# Patient Record
Sex: Male | Born: 1945
Health system: Southern US, Community
[De-identification: ages and names within clinical notes are randomized; demographics above are authoritative.]

## PROBLEM LIST (undated history)

## (undated) DIAGNOSIS — C189 Malignant neoplasm of colon, unspecified: Secondary | ICD-10-CM

## (undated) DIAGNOSIS — E785 Hyperlipidemia, unspecified: Secondary | ICD-10-CM

## (undated) DIAGNOSIS — I10 Essential (primary) hypertension: Secondary | ICD-10-CM

## (undated) DIAGNOSIS — R768 Other specified abnormal immunological findings in serum: Secondary | ICD-10-CM

## (undated) DIAGNOSIS — N189 Chronic kidney disease, unspecified: Secondary | ICD-10-CM

## (undated) DIAGNOSIS — M109 Gout, unspecified: Secondary | ICD-10-CM

## (undated) DIAGNOSIS — B20 Human immunodeficiency virus [HIV] disease: Secondary | ICD-10-CM

## (undated) DIAGNOSIS — N400 Enlarged prostate without lower urinary tract symptoms: Secondary | ICD-10-CM

## (undated) DIAGNOSIS — B1920 Unspecified viral hepatitis C without hepatic coma: Secondary | ICD-10-CM

## (undated) DIAGNOSIS — Z21 Asymptomatic human immunodeficiency virus [HIV] infection status: Secondary | ICD-10-CM

## (undated) DIAGNOSIS — K7460 Unspecified cirrhosis of liver: Secondary | ICD-10-CM

## (undated) HISTORY — DX: Essential (primary) hypertension: I10

## (undated) HISTORY — DX: Chronic kidney disease, unspecified: N18.9

## (undated) HISTORY — DX: Benign prostatic hyperplasia without lower urinary tract symptoms: N40.0

## (undated) HISTORY — DX: Hyperlipidemia, unspecified: E78.5

## (undated) HISTORY — DX: Asymptomatic human immunodeficiency virus (hiv) infection status: Z21

## (undated) HISTORY — DX: Unspecified cirrhosis of liver: K74.60

## (undated) HISTORY — PX: COLONOSCOPY: SHX174

## (undated) HISTORY — DX: Gout, unspecified: M10.9

## (undated) HISTORY — DX: Unspecified viral hepatitis C without hepatic coma: B19.20

## (undated) HISTORY — DX: Malignant neoplasm of colon, unspecified: C18.9

## (undated) HISTORY — DX: Human immunodeficiency virus (HIV) disease: B20

## (undated) HISTORY — DX: Other specified abnormal immunological findings in serum: R76.8

---

## 2004-12-31 ENCOUNTER — Ambulatory Visit (HOSPITAL_COMMUNITY): Admission: RE | Admit: 2004-12-31 | Discharge: 2004-12-31 | Payer: Self-pay | Admitting: Specialist

## 2004-12-31 ENCOUNTER — Encounter: Payer: Self-pay | Admitting: Gastroenterology

## 2016-06-15 ENCOUNTER — Encounter: Payer: Self-pay | Admitting: Gastroenterology

## 2016-09-06 ENCOUNTER — Encounter: Payer: Self-pay | Admitting: Gastroenterology

## 2016-10-29 ENCOUNTER — Encounter: Payer: Self-pay | Admitting: Gastroenterology

## 2016-11-02 ENCOUNTER — Ambulatory Visit (INDEPENDENT_AMBULATORY_CARE_PROVIDER_SITE_OTHER): Payer: Medicare Other | Admitting: Nurse Practitioner

## 2016-11-02 ENCOUNTER — Encounter: Payer: Self-pay | Admitting: Nurse Practitioner

## 2016-11-02 DIAGNOSIS — R768 Other specified abnormal immunological findings in serum: Secondary | ICD-10-CM

## 2016-11-02 LAB — CBC WITH DIFFERENTIAL/PLATELET
BASOS PCT: 1 %
Basophils Absolute: 42 cells/uL (ref 0–200)
EOS PCT: 3 %
Eosinophils Absolute: 126 cells/uL (ref 15–500)
HCT: 30.1 % — ABNORMAL LOW (ref 38.5–50.0)
Hemoglobin: 10.4 g/dL — ABNORMAL LOW (ref 13.2–17.1)
LYMPHS PCT: 28 %
Lymphs Abs: 1176 cells/uL (ref 850–3900)
MCH: 33.8 pg — ABNORMAL HIGH (ref 27.0–33.0)
MCHC: 34.6 g/dL (ref 32.0–36.0)
MCV: 97.7 fL (ref 80.0–100.0)
MONO ABS: 546 {cells}/uL (ref 200–950)
MONOS PCT: 13 %
MPV: 9.3 fL (ref 7.5–12.5)
NEUTROS PCT: 55 %
Neutro Abs: 2310 cells/uL (ref 1500–7800)
PLATELETS: 175 10*3/uL (ref 140–400)
RBC: 3.08 MIL/uL — AB (ref 4.20–5.80)
RDW: 14.1 % (ref 11.0–15.0)
WBC: 4.2 10*3/uL (ref 3.8–10.8)

## 2016-11-02 LAB — COMPREHENSIVE METABOLIC PANEL
ALK PHOS: 114 U/L (ref 40–115)
ALT: 149 U/L — AB (ref 9–46)
AST: 214 U/L — AB (ref 10–35)
Albumin: 3.3 g/dL — ABNORMAL LOW (ref 3.6–5.1)
BILIRUBIN TOTAL: 0.8 mg/dL (ref 0.2–1.2)
BUN: 17 mg/dL (ref 7–25)
CO2: 25 mmol/L (ref 20–32)
Calcium: 9.2 mg/dL (ref 8.6–10.3)
Chloride: 106 mmol/L (ref 98–110)
Creat: 1.27 mg/dL — ABNORMAL HIGH (ref 0.70–1.25)
GLUCOSE: 108 mg/dL — AB (ref 65–99)
Potassium: 3.9 mmol/L (ref 3.5–5.3)
SODIUM: 136 mmol/L (ref 135–146)
Total Protein: 8.6 g/dL — ABNORMAL HIGH (ref 6.1–8.1)

## 2016-11-02 NOTE — Patient Instructions (Signed)
1. Have your blood tests drawn when you're able to. 2. We will schedule the ultrasound of your liver. 3. We will request your previous colonoscopy records from [facility] in order to have been scanned into our system. 4. Return for follow-up in 2 months. 5. We will discuss scheduling of your colonoscopy when you follow-up.

## 2016-11-02 NOTE — Progress Notes (Signed)
Primary Care Physician:  Barry Dienes, NP Primary Gastroenterologist:  Dr. Oneida Alar  Chief Complaint  Patient presents with  . Hepatitis C    HPI:   Noah Robinson is a 71 y.o. male who presents on referral from primary care for positive hepatitis C antibody. Referral included "hepatitis C antibody test positive." No other labs or notes received from primary care. No labs or imaging found in our system.  He is a poor historian and not sure of his health history. Today he states he feels good overall. States he had a bunch of bloodwork completed and doesn't know which tests specifically. Denies abdominal pain, N/V, hematochezia, melena. States he thinks he may have had colon cancer cause he had radiation to his colon "but not sure if I had cancer." Sees Dr. Joya Gaskins in [place], [place] this week. Denies yellowing of skin/eyes, darkened urine, acute episodic confusion, tremors. Denies chest pain, dyspnea, dizziness, lightheadedness, syncope, near syncope. Denies any other upper or lower GI symptoms.  His last colonoscopy was about 10 years ago with Dr. Tamala Julian at [facility]. Denies history of colon surgery.  Hepatitis C Risk Factors:  Birth cohort ([place]): YES IV drug use: No Tattoos: No Blood product transfusion: No HC worker: No Hemodialysis: No Maternal infection: Not that he's aware  Past Medical History:  Diagnosis Date  . Colon cancer ([place])    per patient recollection  . Enlarged prostate     Past Surgical History:  Procedure Laterality Date  . COLONOSCOPY     about 10 years ago    Current Outpatient Prescriptions  Medication Sig Dispense Refill  . tamsulosin (FLOMAX) 0.4 MG CAPS capsule   1   No current facility-administered medications for this visit.     Allergies as of 11/02/2016  . (No Known Allergies)    Family History  Problem Relation Age of Onset  . Colon cancer Neg Hx     Social History   Social History  . Marital status: Divorced   Spouse name: N/A  . Number of children: N/A  . Years of education: N/A   Occupational History  . Not on file.   Social History Main Topics  . Smoking status: Never Smoker  . Smokeless tobacco: Never Used  . Alcohol use No  . Drug use: Yes    Types: Marijuana     Comment: about once a month  . Sexual activity: Not on file   Other Topics Concern  . Not on file   Social History Narrative  . No narrative on file    Review of Systems: Complete ROS negative except as per HPI.    Physical Exam: BP 135/82   Pulse 80   Temp (!) 97.3 F (36.3 C) (Oral)   Ht 5\' 9"  (1.753 m)   Wt 158 lb 12.8 oz (72 kg)   BMI 23.45 kg/m  General:   Alert and oriented. Pleasant and cooperative. Well-nourished and well-developed.  Head:  Normocephalic and atraumatic. Eyes:  Without icterus, sclera clear and conjunctiva pink.  Ears:  Normal auditory acuity. Cardiovascular:  S1, S2 present without murmurs appreciated. Extremities without clubbing or edema. Respiratory:  Clear to auscultation bilaterally. No wheezes, rales, or rhonchi. No distress.  Gastrointestinal:  +BS, soft, non-tender and non-distended. No guarding or rebound. No masses appreciated.  Rectal:  Deferred  Musculoskalatal:  Symmetrical without gross deformities. Neurologic:  Alert and oriented x4;  grossly normal neurologically. Psych:  Alert and cooperative. Normal mood  and affect. Heme/Lymph/Immune: No excessive bruising noted.    11/02/2016 10:01 AM   Disclaimer: This note was dictated with voice recognition software. Similar sounding words can inadvertently be transcribed and may not be corrected upon review.

## 2016-11-02 NOTE — Assessment & Plan Note (Signed)
Referred by primary care for positive hepatitis C antibody. He is generally asymptomatic from a GI and hepatic standpoint. We will proceed with further workup including HCV RNA confirmation, genotype, CBC, CMP, ultrasound elastography to evaluate for fibrosis/cirrhosis. I have explained to the patient the need to do all these tests to determine what his treatment options are. He is agreeable. He is asking to defer scheduling of his colonoscopy until his follow-up visit. That will give [place] time to request records from [facility] for his previous procedure. Return for follow-up in 2 months.

## 2016-11-03 LAB — PROTIME-INR
INR: 1.2 — ABNORMAL HIGH
Prothrombin Time: 12.4 s — ABNORMAL HIGH (ref 9.0–11.5)

## 2016-11-04 NOTE — Progress Notes (Signed)
cc'ed to pcp °

## 2016-11-06 LAB — HEPATITIS C GENOTYPE

## 2016-11-06 LAB — HCV RNA, QN PCR RFLX GENO, LIPA
HCV RNA, PCR, QN: 2000000 [IU]/mL — AB
HCV RNA, PCR, QN: 6.3 {Log_IU}/mL — AB

## 2016-11-08 ENCOUNTER — Ambulatory Visit (HOSPITAL_COMMUNITY)
Admission: RE | Admit: 2016-11-08 | Discharge: 2016-11-08 | Disposition: A | Discharge status: Home / Self Care | Payer: Medicare Other | Source: Ambulatory Visit | Attending: Nurse Practitioner | Admitting: Nurse Practitioner

## 2016-11-08 DIAGNOSIS — N281 Cyst of kidney, acquired: Secondary | ICD-10-CM | POA: Diagnosis not present

## 2016-11-08 DIAGNOSIS — R768 Other specified abnormal immunological findings in serum: Secondary | ICD-10-CM

## 2016-11-08 DIAGNOSIS — K740 Hepatic fibrosis: Secondary | ICD-10-CM | POA: Diagnosis not present

## 2016-11-08 DIAGNOSIS — K8020 Calculus of gallbladder without cholecystitis without obstruction: Secondary | ICD-10-CM | POA: Insufficient documentation

## 2016-11-15 NOTE — Progress Notes (Signed)
Pt is aware and aware of his appt.

## 2017-01-04 ENCOUNTER — Ambulatory Visit: Payer: Medicare Other | Admitting: Nurse Practitioner

## 2017-01-06 ENCOUNTER — Ambulatory Visit: Payer: Medicare Other | Admitting: Nurse Practitioner

## 2017-03-03 ENCOUNTER — Encounter: Payer: Self-pay | Admitting: Nurse Practitioner

## 2017-03-03 ENCOUNTER — Ambulatory Visit: Payer: Medicare Other | Admitting: Nurse Practitioner

## 2017-03-03 DIAGNOSIS — D649 Anemia, unspecified: Secondary | ICD-10-CM | POA: Diagnosis not present

## 2017-03-03 DIAGNOSIS — B182 Chronic viral hepatitis C: Secondary | ICD-10-CM | POA: Diagnosis not present

## 2017-03-03 NOTE — Assessment & Plan Note (Signed)
Patient appears to have worsening anemia.  His hemoglobin last checked by US shows a hemoglobin of 10.4.  Over the past year this is declined from 12.1.  However, his indices are overall normal.  He is also in the past year had worsening kidney function and this could be responsible for his mild anemia.  There is some confusion about his last colonoscopy.  There was one completed in 2005 at [facility] which was normal.  Primary care notes indicate a repeat in 2013 and the patient thinks this might have been done at [facility] prior to radiation for his prostate cancer.  I will have our staff call to [place] and see if they have any record of colonoscopy for him.  I will recheck a CBC, CMP, and add iron studies as well to try to further tease out possible causes of his anemia.  Depending on previous endoscopic results and his current lab results he may be needing a colonoscopy and upper endoscopy.  Return for follow-up in 6-8 weeks.

## 2017-03-03 NOTE — Progress Notes (Signed)
Referring Provider: Barry Dienes, NP Primary Care Physician:  Barry Dienes, NP Primary GI:  Dr. Oneida Alar  Chief Complaint  Patient presents with  . Hepatitis C  . Constipation    HPI:   Noah Robinson is a 72 y.o. male who presents for follow-up on hepatitis C infection as well as constipation.  The patient was last seen in our office 11/02/2016 at which point he was noted to be a poor historian and not sure of his health history.  Felt well overall.  Question if history of colon cancer because he states he had radiation to his colon but was not sure if he had cancer.  Denies overt GI symptoms.  Last colonoscopy 10 years ago with Dr. Tamala Julian [facility].  Recommended hepatitis C RNA confirmation, genotype, CBC, CMP, ultrasound elastography for hepatitis C workup.  Colonoscopy records were requested from any pain hospital to have them scanned into the system.  Follow-up in 2 months.  Labs are completed 11/02/2016.  CBC did find mild anemia with a hemoglobin of 10.4.  Platelets normal at 175.  CMP showed mildly elevated creatinine at 1.27, elevated AST/ALT at 214/149, low albumin at 3.3, normal bilirubin and alkaline phosphatase.  INR mildly elevated at 1.2.  Hepatitis C RNA confirmed with 2 million copies in a log of 6.30.  Genotype found to be 1A.  Complete abdomen ultrasound elastography found cholelithiasis without evidence of cholecystitis or biliary dilation, right renal cyst, normal-sized spleen.  Liver fibrosis score found some F3 and F4 with high risk of advanced fibrosis/cirrhosis.  Today he states he's doing well overall. Denies abdominal pain, N/V, yellowing of skin/eyes, darkened urine, hematochezia, melena, acute episodic confusion, tremors, fever, chills, unintentional weight loss. He states he sometimes has hematuria and straining. Now thinks the radiation was for his prostate (NOT colon). Denies chest pain, dyspnea, dizziness, lightheadedness, syncope, near syncope. Denies any  other upper or lower GI symptoms.  Previously was a heavy drinker. No ETOH in just over a year; quit during radiation.  Past Medical History:  Diagnosis Date  . Colon cancer ([place])    per patient recollection  . Enlarged prostate     Past Surgical History:  Procedure Laterality Date  . COLONOSCOPY     about 10 years ago    Current Outpatient Medications  Medication Sig Dispense Refill  . tamsulosin (FLOMAX) 0.4 MG CAPS capsule   1   No current facility-administered medications for this visit.     Allergies as of 03/03/2017  . (No Known Allergies)    Family History  Problem Relation Age of Onset  . Colon cancer Neg Hx     Social History   Socioeconomic History  . Marital status: Divorced    Spouse name: None  . Number of children: None  . Years of education: None  . Highest education level: None  Social Needs  . Financial resource strain: None  . Food insecurity - worry: None  . Food insecurity - inability: None  . Transportation needs - medical: None  . Transportation needs - non-medical: None  Occupational History  . None  Tobacco Use  . Smoking status: Never Smoker  . Smokeless tobacco: Never Used  Substance and Sexual Activity  . Alcohol use: No  . Drug use: Yes    Types: Marijuana    Comment: about once a month  . Sexual activity: None  Other Topics Concern  . None  Social History Narrative  . None  Review of Systems: General: Negative for anorexia, weight loss, fever, chills, fatigue, weakness. Eyes: Negative for vision changes.  ENT: Negative for hoarseness, difficulty swallowing , nasal congestion. CV: Negative for chest pain, angina, palpitations, dyspnea on exertion, peripheral edema.  Respiratory: Negative for dyspnea at rest, dyspnea on exertion, cough, sputum, wheezing.  GI: See history of present illness. GU:  Negative for dysuria, hematuria, urinary incontinence, urinary frequency, nocturnal urination.  MS: Negative for joint  pain, low back pain.  Derm: Negative for rash or itching.  Neuro: Negative for weakness, abnormal sensation, seizure, frequent headaches, memory loss, confusion.  Psych: Negative for anxiety, depression, suicidal ideation, hallucinations.  Endo: Negative for unusual weight change.  Heme: Negative for bruising or bleeding. Allergy: Negative for rash or hives.   Physical Exam: BP (!) 155/80   Pulse 83   Temp (!) 97.2 F (36.2 C) (Oral)   Ht 5\' 9"  (1.753 m)   Wt 154 lb 9.6 oz (70.1 kg)   BMI 22.83 kg/m  General:   Alert and oriented. Pleasant and cooperative. Well-nourished and well-developed.  Head:  Normocephalic and atraumatic. Eyes:  Without icterus, sclera clear and conjunctiva pink.  Ears:  Normal auditory acuity. Mouth:  No deformity or lesions, oral mucosa pink.  Throat/Neck:  Supple, without mass or thyromegaly. Cardiovascular:  S1, S2 present without murmurs appreciated. Normal pulses noted. Extremities without clubbing or edema. Respiratory:  Clear to auscultation bilaterally. No wheezes, rales, or rhonchi. No distress.  Gastrointestinal:  +BS, soft, non-tender and non-distended. No HSM noted. No guarding or rebound. No masses appreciated.  Rectal:  Deferred  Musculoskalatal:  Symmetrical without gross deformities. Normal posture. Skin:  Intact without significant lesions or rashes. Neurologic:  Alert and oriented x4;  grossly normal neurologically. Psych:  Alert and cooperative. Normal mood and affect. Heme/Lymph/Immune: No significant cervical adenopathy. No excessive bruising noted.    03/03/2017 8:34 AM   Disclaimer: This note was dictated with voice recognition software. Similar sounding words can inadvertently be transcribed and may not be corrected upon review.

## 2017-03-03 NOTE — Progress Notes (Signed)
CC'ED TO PCP 

## 2017-03-03 NOTE — Assessment & Plan Note (Signed)
The patient has hepatitis C as confirmed by RNA.  He has 2 million copies.  His AST/ALT were a bit elevated at last check and we will recheck this.  Ultrasound elastography showed some F3/F4 indicating high-grade fibrosis/early cirrhosis.  His spleen and platelets are both normal at this time.  He previously drank significantly but has not had any alcohol in over 1 year.  I advised him to avoid all alcohol moving forward.  He verbalized understanding.  If this point we will initiate prior authorization for treatment of his hepatitis C, preferably with [facility], although this will depend on his insurance.  Return for follow-up in 6-8 weeks.

## 2017-03-03 NOTE — Patient Instructions (Signed)
1. We will call [facility] to find out if you had a colonoscopy there in 2013. 2. We will start the process to get approval from your insurance company for medications to treat hepatitis C. 3. Have your labs drawn when you are able to. 4. Return for follow-up in 6-8 weeks. 5. As we discussed, avoid all alcohol moving forward. 6. Call if you have any questions or concerns.

## 2017-03-04 ENCOUNTER — Telehealth: Payer: Self-pay | Admitting: Nurse Practitioner

## 2017-03-04 DIAGNOSIS — B182 Chronic viral hepatitis C: Secondary | ICD-10-CM

## 2017-03-04 NOTE — Telephone Encounter (Signed)
Please tell the patient that some of his labs orders did not occur. I will need to reorder them prior to ordering his medication. Will need to check HIV and Hep B serologies.  Have these done as soon as possible. We will hold the prior auth for his medications until we know those are good.  Cc: MS as FYI related to PA for [facility]

## 2017-03-04 NOTE — Progress Notes (Signed)
Received fax from [facility]. No colonoscopy records found.

## 2017-03-07 NOTE — Telephone Encounter (Signed)
Tried to call. VM not set up. Mailing a letter for pt to call.

## 2017-03-16 ENCOUNTER — Telehealth: Payer: Self-pay | Admitting: Gastroenterology

## 2017-03-16 NOTE — Telephone Encounter (Signed)
Pt received a letter that DS had been trying to reach him. Please call him back at [phone]

## 2017-03-16 NOTE — Telephone Encounter (Signed)
Pt is aware he needs more labs per Walden Field, NP. He will do those as soon as possible so we will be able to order his medication.

## 2017-03-17 NOTE — Telephone Encounter (Signed)
Pt came by the office with lab results of CBC and CMP. I gave him the lab orders for the hep b serologies and HIV. Also, he still had the order for original blood work and I told him to just tell the lab he needs the iron and ferritin. He brought me the fax number for PCP to fax iron lab results to him when available.

## 2017-03-18 LAB — HIV ANTIBODY (ROUTINE TESTING W REFLEX): HIV 1&2 Ab, 4th Generation: REACTIVE — AB

## 2017-03-18 LAB — HEPATITIS B CORE ANTIBODY, TOTAL: HEP B C TOTAL AB: NONREACTIVE

## 2017-03-18 LAB — HEPATITIS B SURFACE ANTIGEN: Hepatitis B Surface Ag: NONREACTIVE

## 2017-03-18 LAB — HIV-1/2 AB - DIFFERENTIATION
HIV 1 ANTIBODY: POSITIVE — AB
HIV 2 AB: NEGATIVE

## 2017-03-18 LAB — FERRITIN: FERRITIN: 372 ng/mL (ref 20–380)

## 2017-03-18 LAB — IRON: IRON: 89 ug/dL (ref 50–180)

## 2017-03-18 LAB — HEPATITIS B SURFACE ANTIBODY,QUALITATIVE: HEP B S AB: NONREACTIVE

## 2017-03-21 ENCOUNTER — Telehealth: Payer: Self-pay | Admitting: Infectious Disease

## 2017-03-21 ENCOUNTER — Other Ambulatory Visit: Payer: Self-pay

## 2017-03-21 DIAGNOSIS — B20 Human immunodeficiency virus [HIV] disease: Secondary | ICD-10-CM

## 2017-03-21 DIAGNOSIS — B182 Chronic viral hepatitis C: Secondary | ICD-10-CM

## 2017-03-21 NOTE — Telephone Encounter (Signed)
Dr Oneida Alar we can see via an alert that this patient tested + for HIV and is 12  We would be happy to see him asap to get him onto meds.  Have you reached out to him  To discuss his test results at all?  Darnelle Maffucci, can you alert [facility] and can we arrange for him to be seen at [facility]?

## 2017-03-21 NOTE — Telephone Encounter (Signed)
Information forwarded to [facility] will await call to see if they can make contact.  No good number on file and has a [address] as address.

## 2017-03-21 NOTE — Telephone Encounter (Signed)
WILL CONTACT PT TODAY IF POSSIBLE AND I WILL MAKE THE REFERRAL TO [facility] FOR TREATMENT OF HIV AND HEP C.

## 2017-04-21 NOTE — Progress Notes (Signed)
Labs received that were ordered at last visit and drawn at an outside facility.  Labs were collected 03/08/2017.  Noted continued anemia with hemoglobin of 10.3, macrocytic hyperchromic, platelets low at 136.  AST/ALT remain elevated at 200/140.  Alkaline phosphatase elevated at 149, bilirubin normal at 0.8.  He does have chronic hepatitis C.  However, his transaminases are a bit higher than we would expect with chronic infection.  He will likely need further workup related to this.  We can check this at his next office visit.  He has been referred to infectious disease for evaluation and treatment due to coinfection with HIV.

## 2017-05-02 ENCOUNTER — Ambulatory Visit: Payer: Medicare Other | Admitting: Nurse Practitioner

## 2017-05-02 ENCOUNTER — Encounter: Payer: Self-pay | Admitting: Nurse Practitioner

## 2017-05-02 ENCOUNTER — Other Ambulatory Visit: Payer: Self-pay

## 2017-05-02 ENCOUNTER — Telehealth: Payer: Self-pay

## 2017-05-02 DIAGNOSIS — D649 Anemia, unspecified: Secondary | ICD-10-CM

## 2017-05-02 DIAGNOSIS — B182 Chronic viral hepatitis C: Secondary | ICD-10-CM | POA: Diagnosis not present

## 2017-05-02 DIAGNOSIS — B20 Human immunodeficiency virus [HIV] disease: Secondary | ICD-10-CM | POA: Diagnosis not present

## 2017-05-02 DIAGNOSIS — K7460 Unspecified cirrhosis of liver: Secondary | ICD-10-CM | POA: Diagnosis not present

## 2017-05-02 DIAGNOSIS — K625 Hemorrhage of anus and rectum: Secondary | ICD-10-CM

## 2017-05-02 MED ORDER — PEG 3350-KCL-NA BICARB-NACL 420 G PO SOLR: 4000.0000 mL | ORAL | 0 refills | Status: DC

## 2017-05-02 NOTE — Progress Notes (Signed)
CC'ED TO PCP 

## 2017-05-02 NOTE — Assessment & Plan Note (Signed)
During routine workup of hepatitis C the patient tested positive for HIV.  He was referred to infectious disease for treatment of both.  He has not made an appointment yet.  I encouraged him to do so.

## 2017-05-02 NOTE — Progress Notes (Addendum)
REVIEWED-NO ADDITIONAL RECOMMENDATIONS.  Referring Provider: Barry Dienes, NP Primary Care Physician:  Barry Dienes, NP Primary GI:  Dr. Oneida Alar  Chief Complaint  Patient presents with  . Anemia    f/u. Doing okay    HPI:   Noah Robinson is a 72 y.o. male who presents for follow-up on anemia.  The patient was last seen in our office 03/03/2017 for anemia and chronic hepatitis C.  At his last visit it was noted he was hepatitis C positive. Liver fibrosis found to be F3 and F4 with high risk of advanced fibrosis/cirrhosis.  At his last visit he was doing well overall without overt hepatic symptoms.  Intermittent hematuria, status post his prostate radiation historically.  No other GI symptoms.  Previous heavy drinker but no alcohol in over a year.  Recommended routine workup for hepatitis C treatment.  Labs reviewed which found continued anemia with hemoglobin 10.3 which was macrocytic and hyperchromic, low platelets at 136, AST/ALT elevated at 200/140, alkaline phosphatase elevated at 149, bilirubin normal.  The patient was also noted to be HIV positive.  Recommended referral to infectious disease for evaluation and treatment of co-infected hepatitis C and HIV.  Requested previous colonoscopy records and was told there were no records on file.  Unsure of date of last colonoscopy or result.  Today he states he's doing well overall. He was called by [facility] but held of for the time being "until I can see you guys for HCV." I explained to the patient that [facility] would be treating his HCV (as well as HIV). Denies abdominal pain, N/V. Previously had hematochezia, last episode a couple weeks ago but none since; this is intermittent. Denies melena, fever, chills, unintentional weight loss. Denies yellowing of skin/eyes, N/V, darkened urine, abdominal swelling, LE edema, acute episodic confusion. Denies chest pain, dyspnea, dizziness, lightheadedness, syncope, near syncope. Denies any other upper or lower GI  symptoms.  Not currently taking any medications.  Past Medical History:  Diagnosis Date  . Cirrhosis ([place])   . Colon cancer ([place])    per patient recollection  . Enlarged prostate   . HCV (hepatitis C virus)   . HIV (human immunodeficiency virus infection) ([place])     Past Surgical History:  Procedure Laterality Date  . COLONOSCOPY     about 10 years ago    No current outpatient medications on file.   No current facility-administered medications for this visit.     Allergies as of 05/02/2017  . (No Known Allergies)    Family History  Problem Relation Age of Onset  . Colon cancer Neg Hx     Social History   Socioeconomic History  . Marital status: Divorced    Spouse name: None  . Number of children: None  . Years of education: None  . Highest education level: None  Social Needs  . Financial resource strain: None  . Food insecurity - worry: None  . Food insecurity - inability: None  . Transportation needs - medical: None  . Transportation needs - non-medical: None  Occupational History  . None  Tobacco Use  . Smoking status: Never Smoker  . Smokeless tobacco: Never Used  Substance and Sexual Activity  . Alcohol use: No  . Drug use: Yes    Types: Marijuana    Comment: about once a month  . Sexual activity: None  Other Topics Concern  . None  Social History Narrative  . None    Review of Systems: Complete ROS  negative except as per HPI.   Physical Exam: BP (!) 165/77   Pulse 92   Temp 98.9 F (37.2 C) (Oral)   Ht 5\' 9"  (1.753 m)   Wt 158 lb 6.4 oz (71.8 kg)   BMI 23.39 kg/m  General:   Alert and oriented. Pleasant and cooperative. Well-nourished and well-developed.  Eyes:  Without icterus, sclera clear and conjunctiva pink.  Ears:  Normal auditory acuity. Cardiovascular:  S1, S2 present without murmurs appreciated. Extremities without clubbing or edema. Respiratory:  Clear to auscultation bilaterally. No wheezes, rales, or rhonchi. No  distress.  Gastrointestinal:  +BS, soft, non-tender and non-distended. No HSM noted. No guarding or rebound. No masses appreciated.  Rectal:  Deferred  Musculoskalatal:  Symmetrical without gross deformities. Neurologic:  Alert and oriented x4;  grossly normal neurologically. Psych:  Alert and cooperative. Normal mood and affect. Heme/Lymph/Immune: No excessive bruising noted.    05/02/2017 8:53 AM   Disclaimer: This note was dictated with voice recognition software. Similar sounding words can inadvertently be transcribed and may not be corrected upon review.

## 2017-05-02 NOTE — Assessment & Plan Note (Signed)
Noted anemia with his last hemoglobin 10.3.  He states he had a colonoscopy about 10 years ago at [facility].  [place] replied I did not have any records related to this patient.  He does admit some hematochezia.  His anemia is macrocytic and hyperchromic.  His ferritin and iron are both normal.  I doubt slow chronic bleed at this point.  Likely anemia of chronic disease.  However, given it is been 10 years since his colonoscopy and the fact that he has F3/F4 fibrosis/cirrhosis as well as a depressed platelet count indicating possible portal hypertension we will proceed with a colonoscopy and endoscopy at this time.  Proceed with colonoscopy and EGD on propofol/MAC with Dr. Oneida Alar in the near future. The risks, benefits, and alternatives have been discussed in detail with the patient. They state understanding and desire to proceed.   The patient is not on any medications.  History of heavy alcohol use, none in approximately 1 year.  Uses marijuana about once a month.  No other anticoagulants, anxiolytics, chronic pain medications, or antidepressants.  We will plan for the procedure on propofol/MAC to promote adequate sedation.

## 2017-05-02 NOTE — Patient Instructions (Signed)
1. Bring a copy of the labs that you drew at her last office visit about 1 week ago to the lab. 2. Have your labs drawn at that time.  They can cancel any lab that was drawn last week and is found on your lab results from last week that you will bring to the lab. 3. Bring a copy of your labs from last week to our office when you leave the lab. 4. We will give you the phone number for infectious disease.  Please make an appointment as soon as you can. 5. We will schedule your colonoscopy and upper endoscopy for you. 6. Further recommendations will be made based on the results of your colonoscopy and endoscopy. 7. Follow-up in 2 months. 8. Call us if you have any questions or concerns.

## 2017-05-02 NOTE — Assessment & Plan Note (Signed)
The patient has likely cirrhosis with F3/F4 Matavir score in the setting of hepatitis C and HIV coinfection as well as history of heavy alcohol use.  He has a depressed platelet count indicating possible portal hypertension.  We will plan for an EGD as per above.  I will recheck labs today including CBC, BMP, INR, AFP, HFP.  This will allow routine cirrhosis workup and to follow his elevated transaminases.  Depending on the results he may need further evaluation including possible autoimmune serologies and/or MRCP, liver biopsy.  We will make further plans based on results.  Follow-up in 2 months.

## 2017-05-02 NOTE — Assessment & Plan Note (Signed)
Noted chronic hepatitis C.  He has been referred to infectious disease for treatment of hepatitis C and coinfection of HIV.  He has not made the appointment yet.  I encouraged him to make this appointment as soon as possible.

## 2017-05-02 NOTE — Telephone Encounter (Signed)
Called and informed pt of pre-op appt 06/22/17 at 9:00am. Letter mailed.

## 2017-05-05 ENCOUNTER — Other Ambulatory Visit: Payer: Medicare Other

## 2017-05-05 ENCOUNTER — Ambulatory Visit: Payer: Medicare Other

## 2017-05-17 ENCOUNTER — Ambulatory Visit: Payer: Medicare Other | Admitting: Internal Medicine

## 2017-05-17 ENCOUNTER — Ambulatory Visit: Payer: Medicare Other

## 2017-05-19 ENCOUNTER — Encounter: Payer: Medicare Other | Admitting: Family

## 2017-05-19 ENCOUNTER — Ambulatory Visit: Payer: Medicare Other

## 2017-05-24 ENCOUNTER — Ambulatory Visit: Payer: Medicare Other | Admitting: Internal Medicine

## 2017-06-20 NOTE — Patient Instructions (Signed)
ILYAAS MUSTO  06/20/2017     @PREFPERIOPPHARMACY @   Your procedure is scheduled on 06/28/2017.  Report to [facility] at 7:15 A.M.  Call this number if you have problems the morning of surgery:  [phone]   Remember:  Do not eat food or drink liquids after midnight.  Take these medicines the morning of surgery with A SIP OF WATER None   Do not wear jewelry, make-up or nail polish.  Do not wear lotions, powders, or perfumes, or deodorant.  Do not shave 48 hours prior to surgery.  Men may shave face and neck.  Do not bring valuables to the hospital.  [facility] is not responsible for any belongings or valuables.  Contacts, dentures or bridgework may not be worn into surgery.  Leave your suitcase in the car.  After surgery it may be brought to your room.  For patients admitted to the hospital, discharge time will be determined by your treatment team.  Patients discharged the day of surgery will not be allowed to drive home.    Please read over the following fact sheets that you were given. Anesthesia Post-op Instructions     PATIENT INSTRUCTIONS POST-ANESTHESIA  IMMEDIATELY FOLLOWING SURGERY:  Do not drive or operate machinery for the first twenty four hours after surgery.  Do not make any important decisions for twenty four hours after surgery or while taking narcotic pain medications or sedatives.  If you develop intractable nausea and vomiting or a severe headache please notify your doctor immediately.  FOLLOW-UP:  Please make an appointment with your surgeon as instructed. You do not need to follow up with anesthesia unless specifically instructed to do so.  WOUND CARE INSTRUCTIONS (if applicable):  Keep a dry clean dressing on the anesthesia/puncture wound site if there is drainage.  Once the wound has quit draining you may leave it open to air.  Generally you should leave the bandage intact for twenty four hours unless there is drainage.  If the epidural site drains  for more than 36-48 hours please call the anesthesia department.  QUESTIONS?:  Please feel free to call your physician or the hospital operator if you have any questions, and they will be happy to assist you.      Colonoscopy, Adult A colonoscopy is an exam to look at the entire large intestine. During the exam, a lubricated, bendable tube is inserted into the anus and then passed into the rectum, colon, and other parts of the large intestine. A colonoscopy is often done as a part of normal colorectal screening or in response to certain symptoms, such as anemia, persistent diarrhea, abdominal pain, and blood in the stool. The exam can help screen for and diagnose medical problems, including:  Tumors.  Polyps.  Inflammation.  Areas of bleeding.  Tell a health care provider about:  Any allergies you have.  All medicines you are taking, including vitamins, herbs, eye drops, creams, and over-the-counter medicines.  Any problems you or family members have had with anesthetic medicines.  Any blood disorders you have.  Any surgeries you have had.  Any medical conditions you have.  Any problems you have had passing stool. What are the risks? Generally, this is a safe procedure. However, problems may occur, including:  Bleeding.  A tear in the intestine.  A reaction to medicines given during the exam.  Infection (rare).  What happens before the procedure? Eating and drinking restrictions Follow instructions from your health care provider about eating  and drinking, which may include:  A few days before the procedure - follow a low-fiber diet. Avoid nuts, seeds, dried fruit, raw fruits, and vegetables.  1-3 days before the procedure - follow a clear liquid diet. Drink only clear liquids, such as clear broth or bouillon, black coffee or tea, clear juice, clear soft drinks or sports drinks, gelatin dessert, and popsicles. Avoid any liquids that contain red or purple dye.  On the  day of the procedure - do not eat or drink anything during the 2 hours before the procedure, or within the time period that your health care provider recommends.  Bowel prep If you were prescribed an oral bowel prep to clean out your colon:  Take it as told by your health care provider. Starting the day before your procedure, you will need to drink a large amount of medicated liquid. The liquid will cause you to have multiple loose stools until your stool is almost clear or light green.  If your skin or anus gets irritated from diarrhea, you may use these to relieve the irritation: ? Medicated wipes, such as adult wet wipes with aloe and vitamin E. ? A skin soothing-product like petroleum jelly.  If you vomit while drinking the bowel prep, take a break for up to 60 minutes and then begin the bowel prep again. If vomiting continues and you cannot take the bowel prep without vomiting, call your health care provider.  General instructions  Ask your health care provider about changing or stopping your regular medicines. This is especially important if you are taking diabetes medicines or blood thinners.  Plan to have someone take you home from the hospital or clinic. What happens during the procedure?  An IV tube may be inserted into one of your veins.  You will be given medicine to help you relax (sedative).  To reduce your risk of infection: ? Your health care team will wash or sanitize their hands. ? Your anal area will be washed with soap.  You will be asked to lie on your side with your knees bent.  Your health care provider will lubricate a long, thin, flexible tube. The tube will have a camera and a light on the end.  The tube will be inserted into your anus.  The tube will be gently eased through your rectum and colon.  Air will be delivered into your colon to keep it open. You may feel some pressure or cramping.  The camera will be used to take images during the  procedure.  A small tissue sample may be removed from your body to be examined under a microscope (biopsy). If any potential problems are found, the tissue will be sent to a lab for testing.  If small polyps are found, your health care provider may remove them and have them checked for cancer cells.  The tube that was inserted into your anus will be slowly removed. The procedure may vary among health care providers and hospitals. What happens after the procedure?  Your blood pressure, heart rate, breathing rate, and blood oxygen level will be monitored until the medicines you were given have worn off.  Do not drive for 24 hours after the exam.  You may have a small amount of blood in your stool.  You may pass gas and have mild abdominal cramping or bloating due to the air that was used to inflate your colon during the exam.  It is up to you to get the results of  your procedure. Ask your health care provider, or the department performing the procedure, when your results will be ready. This information is not intended to replace advice given to you by your health care provider. Make sure you discuss any questions you have with your health care provider. Document Released: 02/13/2000 Document Revised: 12/17/2015 Document Reviewed: 04/29/2015 Elsevier Interactive Patient Education  2018 [employer].

## 2017-06-22 ENCOUNTER — Encounter (HOSPITAL_COMMUNITY): Payer: Self-pay

## 2017-06-22 ENCOUNTER — Other Ambulatory Visit: Payer: Self-pay

## 2017-06-22 ENCOUNTER — Encounter (HOSPITAL_COMMUNITY)
Admission: RE | Admit: 2017-06-22 | Discharge: 2017-06-22 | Disposition: A | Discharge status: Home / Self Care | Payer: Medicare Other | Source: Ambulatory Visit | Attending: Gastroenterology | Admitting: Gastroenterology

## 2017-06-22 DIAGNOSIS — N400 Enlarged prostate without lower urinary tract symptoms: Secondary | ICD-10-CM | POA: Diagnosis not present

## 2017-06-22 DIAGNOSIS — Z01812 Encounter for preprocedural laboratory examination: Secondary | ICD-10-CM | POA: Diagnosis present

## 2017-06-22 DIAGNOSIS — K7460 Unspecified cirrhosis of liver: Secondary | ICD-10-CM | POA: Diagnosis not present

## 2017-06-22 DIAGNOSIS — B20 Human immunodeficiency virus [HIV] disease: Secondary | ICD-10-CM | POA: Insufficient documentation

## 2017-06-22 DIAGNOSIS — Z01810 Encounter for preprocedural cardiovascular examination: Secondary | ICD-10-CM | POA: Diagnosis not present

## 2017-06-22 DIAGNOSIS — B1920 Unspecified viral hepatitis C without hepatic coma: Secondary | ICD-10-CM | POA: Insufficient documentation

## 2017-06-22 DIAGNOSIS — I4510 Unspecified right bundle-branch block: Secondary | ICD-10-CM | POA: Insufficient documentation

## 2017-06-28 ENCOUNTER — Ambulatory Visit (HOSPITAL_COMMUNITY): Payer: Medicare Other | Admitting: Anesthesiology

## 2017-06-28 ENCOUNTER — Ambulatory Visit (HOSPITAL_COMMUNITY)
Admission: RE | Admit: 2017-06-28 | Discharge: 2017-06-28 | Disposition: A | Discharge status: Home / Self Care | Payer: Medicare Other | Source: Ambulatory Visit | Attending: Gastroenterology | Admitting: Gastroenterology

## 2017-06-28 ENCOUNTER — Encounter (HOSPITAL_COMMUNITY)
Admission: RE | Disposition: A | Discharge status: Home / Self Care | Payer: Self-pay | Source: Ambulatory Visit | Attending: Gastroenterology

## 2017-06-28 ENCOUNTER — Encounter (HOSPITAL_COMMUNITY): Payer: Self-pay | Admitting: Anesthesiology

## 2017-06-28 DIAGNOSIS — K7460 Unspecified cirrhosis of liver: Secondary | ICD-10-CM | POA: Insufficient documentation

## 2017-06-28 DIAGNOSIS — K2970 Gastritis, unspecified, without bleeding: Secondary | ICD-10-CM | POA: Diagnosis not present

## 2017-06-28 DIAGNOSIS — K766 Portal hypertension: Secondary | ICD-10-CM | POA: Diagnosis not present

## 2017-06-28 DIAGNOSIS — K222 Esophageal obstruction: Secondary | ICD-10-CM | POA: Diagnosis not present

## 2017-06-28 DIAGNOSIS — Z923 Personal history of irradiation: Secondary | ICD-10-CM | POA: Insufficient documentation

## 2017-06-28 DIAGNOSIS — D124 Benign neoplasm of descending colon: Secondary | ICD-10-CM | POA: Diagnosis not present

## 2017-06-28 DIAGNOSIS — B1920 Unspecified viral hepatitis C without hepatic coma: Secondary | ICD-10-CM | POA: Insufficient documentation

## 2017-06-28 DIAGNOSIS — Z85038 Personal history of other malignant neoplasm of large intestine: Secondary | ICD-10-CM | POA: Diagnosis not present

## 2017-06-28 DIAGNOSIS — K644 Residual hemorrhoidal skin tags: Secondary | ICD-10-CM | POA: Diagnosis not present

## 2017-06-28 DIAGNOSIS — Z21 Asymptomatic human immunodeficiency virus [HIV] infection status: Secondary | ICD-10-CM | POA: Insufficient documentation

## 2017-06-28 DIAGNOSIS — K5730 Diverticulosis of large intestine without perforation or abscess without bleeding: Secondary | ICD-10-CM | POA: Diagnosis not present

## 2017-06-28 DIAGNOSIS — I8500 Esophageal varices without bleeding: Secondary | ICD-10-CM

## 2017-06-28 DIAGNOSIS — D123 Benign neoplasm of transverse colon: Secondary | ICD-10-CM | POA: Diagnosis not present

## 2017-06-28 DIAGNOSIS — D649 Anemia, unspecified: Secondary | ICD-10-CM | POA: Insufficient documentation

## 2017-06-28 DIAGNOSIS — K5521 Angiodysplasia of colon with hemorrhage: Secondary | ICD-10-CM | POA: Diagnosis not present

## 2017-06-28 DIAGNOSIS — K3189 Other diseases of stomach and duodenum: Secondary | ICD-10-CM | POA: Insufficient documentation

## 2017-06-28 DIAGNOSIS — B9681 Helicobacter pylori [H. pylori] as the cause of diseases classified elsewhere: Secondary | ICD-10-CM | POA: Diagnosis not present

## 2017-06-28 DIAGNOSIS — K449 Diaphragmatic hernia without obstruction or gangrene: Secondary | ICD-10-CM | POA: Diagnosis not present

## 2017-06-28 DIAGNOSIS — I8510 Secondary esophageal varices without bleeding: Secondary | ICD-10-CM | POA: Diagnosis not present

## 2017-06-28 HISTORY — PX: POLYPECTOMY: SHX5525

## 2017-06-28 HISTORY — PX: COLONOSCOPY WITH PROPOFOL: SHX5780

## 2017-06-28 HISTORY — PX: BIOPSY: SHX5522

## 2017-06-28 HISTORY — PX: ESOPHAGOGASTRODUODENOSCOPY (EGD) WITH PROPOFOL: SHX5813

## 2017-06-28 LAB — CBC
HCT: 31.1 % — ABNORMAL LOW (ref 39.0–52.0)
HEMOGLOBIN: 9.8 g/dL — AB (ref 13.0–17.0)
MCH: 30.4 pg (ref 26.0–34.0)
MCHC: 31.5 g/dL (ref 30.0–36.0)
MCV: 96.6 fL (ref 78.0–100.0)
PLATELETS: 142 10*3/uL — AB (ref 150–400)
RBC: 3.22 MIL/uL — AB (ref 4.22–5.81)
RDW: 13.7 % (ref 11.5–15.5)
WBC: 3.3 10*3/uL — ABNORMAL LOW (ref 4.0–10.5)

## 2017-06-28 LAB — VITAMIN B12: Vitamin B-12: 676 pg/mL (ref 180–914)

## 2017-06-28 SURGERY — COLONOSCOPY WITH PROPOFOL
Anesthesia: Monitor Anesthesia Care

## 2017-06-28 MED ORDER — LACTATED RINGERS IV SOLN
INTRAVENOUS | Status: DC
  Administered 2017-06-28: 1000 mL via INTRAVENOUS

## 2017-06-28 MED ORDER — MIDAZOLAM HCL 2 MG/2ML IJ SOLN
INTRAMUSCULAR | Status: AC
  Filled 2017-06-28: qty 2

## 2017-06-28 MED ORDER — PROPOFOL 10 MG/ML IV BOLUS
INTRAVENOUS | Status: AC
  Filled 2017-06-28: qty 20

## 2017-06-28 MED ORDER — STERILE WATER FOR IRRIGATION IR SOLN
Status: DC | PRN
  Administered 2017-06-28: 200 mL

## 2017-06-28 MED ORDER — MIDAZOLAM HCL 2 MG/2ML IJ SOLN
INTRAMUSCULAR | Status: DC | PRN
  Administered 2017-06-28 (×2): 1 mg via INTRAVENOUS

## 2017-06-28 MED ORDER — LACTATED RINGERS IV SOLN
INTRAVENOUS | Status: DC | PRN
  Administered 2017-06-28: 09:00:00 via INTRAVENOUS

## 2017-06-28 MED ORDER — PROPOFOL 10 MG/ML IV BOLUS
INTRAVENOUS | Status: DC | PRN
  Administered 2017-06-28: 10 mg via INTRAVENOUS

## 2017-06-28 MED ORDER — FENTANYL CITRATE (PF) 100 MCG/2ML IJ SOLN
INTRAMUSCULAR | Status: AC
  Filled 2017-06-28: qty 2

## 2017-06-28 MED ORDER — LIDOCAINE VISCOUS 2 % MT SOLN
5.0000 mL | Freq: Two times a day (BID) | OROMUCOSAL | Status: DC
  Administered 2017-06-28: 5 mL via OROMUCOSAL

## 2017-06-28 MED ORDER — LIDOCAINE VISCOUS 2 % MT SOLN
OROMUCOSAL | Status: AC
  Filled 2017-06-28: qty 15

## 2017-06-28 MED ORDER — FENTANYL CITRATE (PF) 250 MCG/5ML IJ SOLN
INTRAMUSCULAR | Status: DC | PRN
  Administered 2017-06-28 (×2): 25 ug via INTRAVENOUS

## 2017-06-28 MED ORDER — PROPOFOL 500 MG/50ML IV EMUL
INTRAVENOUS | Status: DC | PRN
  Administered 2017-06-28: 10:00:00 via INTRAVENOUS
  Administered 2017-06-28: 150 ug/kg/min via INTRAVENOUS
  Administered 2017-06-28: 10:00:00 via INTRAVENOUS

## 2017-06-28 MED ORDER — LIDOCAINE HCL (CARDIAC) PF 100 MG/5ML IV SOSY
PREFILLED_SYRINGE | INTRAVENOUS | Status: DC | PRN
  Administered 2017-06-28: 40 mg via INTRAVENOUS

## 2017-06-28 NOTE — Transfer of Care (Signed)
Immediate Anesthesia Transfer of Care Note  Patient: KEELYN FJELSTAD  Procedure(s) Performed: COLONOSCOPY WITH PROPOFOL (N/A ) ESOPHAGOGASTRODUODENOSCOPY (EGD) WITH PROPOFOL (N/A ) POLYPECTOMY BIOPSY  Patient Location: PACU  Anesthesia Type:MAC  Level of Consciousness: awake, alert , oriented and patient cooperative  Airway & Oxygen Therapy: Patient Spontanous Breathing and Patient connected to nasal cannula oxygen  Post-op Assessment: Report given to RN and Post -op Vital signs reviewed and stable  Post vital signs: Reviewed and stable  Last Vitals:  Vitals Value Taken Time  BP 141/72 06/28/2017 10:23 AM  Temp    Pulse 38 06/28/2017 10:26 AM  Resp 20 06/28/2017 10:27 AM  SpO2 85 % 06/28/2017 10:26 AM  Vitals shown include unvalidated device data.  Last Pain:  Vitals:   06/28/17 1016  PainSc: 0-No pain         Complications: No apparent anesthesia complications

## 2017-06-28 NOTE — Anesthesia Postprocedure Evaluation (Signed)
Anesthesia Post Note  Patient: Noah Robinson  Procedure(s) Performed: COLONOSCOPY WITH PROPOFOL (N/A ) ESOPHAGOGASTRODUODENOSCOPY (EGD) WITH PROPOFOL (N/A ) POLYPECTOMY BIOPSY  Patient location during evaluation: PACU Anesthesia Type: MAC Level of consciousness: awake, awake and alert, oriented and patient cooperative Pain management: pain level controlled Vital Signs Assessment: post-procedure vital signs reviewed and stable Respiratory status: spontaneous breathing, nonlabored ventilation, respiratory function stable and patient connected to nasal cannula oxygen Cardiovascular status: stable Postop Assessment: no apparent nausea or vomiting Anesthetic complications: no     Last Vitals:  Vitals:   06/28/17 0830 06/28/17 0845  BP:    Pulse:    Resp: 17 (!) 42  Temp:    SpO2: 100% 100%    Last Pain:  Vitals:   06/28/17 1016  PainSc: 0-No pain                 Alyrica Thurow L

## 2017-06-28 NOTE — Anesthesia Preprocedure Evaluation (Signed)
Anesthesia Evaluation  Patient identified by MRN, date of birth, ID band Patient awake    Reviewed: Allergy & Precautions, H&P , NPO status , Patient's Chart, lab work & pertinent test results, reviewed documented beta blocker date and time   Airway Mallampati: II  TM Distance: <3 FB Neck ROM: full    Dental no notable dental hx. (+) Teeth Intact, Poor Dentition, Chipped, Missing, Loose   Pulmonary neg pulmonary ROS,    Pulmonary exam normal breath sounds clear to auscultation       Cardiovascular Exercise Tolerance: Good negative cardio ROS Normal cardiovascular exam Rhythm:regular Rate:Normal     Neuro/Psych negative neurological ROS  negative psych ROS   GI/Hepatic negative GI ROS, Neg liver ROS,   Endo/Other  negative endocrine ROS  Renal/GU negative Renal ROS  negative genitourinary   Musculoskeletal   Abdominal   Peds  Hematology negative hematology ROS (+)   Anesthesia Other Findings Patient reports recent h/o prostate cancer treated with pelvic radiation. No clinical complaints Significant dental dysrepair  Reproductive/Obstetrics negative OB ROS                             Anesthesia Physical Anesthesia Plan  ASA: II  Anesthesia Plan: MAC   Post-op Pain Management:    Induction:   PONV Risk Score and Plan:   Airway Management Planned:   Additional Equipment:   Intra-op Plan:   Post-operative Plan:   Informed Consent: I have reviewed the patients History and Physical, chart, labs and discussed the procedure including the risks, benefits and alternatives for the proposed anesthesia with the patient or authorized representative who has indicated his/her understanding and acceptance.   Dental Advisory Given  Plan Discussed with: CRNA  Anesthesia Plan Comments:         Anesthesia Quick Evaluation

## 2017-06-28 NOTE — H&P (Addendum)
Primary Care Physician:  Barry Dienes, NP Primary Gastroenterologist:  Dr. Oneida Alar  Pre-Procedure History & Physical: HPI:  Noah Robinson is a 72 y.o. male here for Macrocytic anemia/HEP C/HIV-US W/ ELASTOGRAPHY SEP 2018 SUGGEST CIRRHOSIS(F3/F4 FIBROSIS). SEP 2018: PLT CT NL/Hb 10.8 MCV 97.7.  Past Medical History:  Diagnosis Date  . Cirrhosis ([place])   . Colon cancer ([place])    per patient recollection  . Enlarged prostate   . HCV (hepatitis C virus)   . HIV (human immunodeficiency virus infection) ([place])     Past Surgical History:  Procedure Laterality Date  . COLONOSCOPY     about 10 years ago    Prior to Admission medications   Medication Sig Start Date End Date Taking? Authorizing Provider  polyethylene glycol-electrolytes (TRILYTE) 420 g solution Take 4,000 mLs by mouth as directed. 05/02/17   Danie Binder, MD    Allergies as of 05/02/2017  . (No Known Allergies)    Family History  Problem Relation Age of Onset  . Colon cancer Neg Hx     Social History   Socioeconomic History  . Marital status: Divorced    Spouse name: Not on file  . Number of children: Not on file  . Years of education: Not on file  . Highest education level: Not on file  Occupational History  . Not on file  Social Needs  . Financial resource strain: Not on file  . Food insecurity:    Worry: Not on file    Inability: Not on file  . Transportation needs:    Medical: Not on file    Non-medical: Not on file  Tobacco Use  . Smoking status: Never Smoker  . Smokeless tobacco: Never Used  Substance and Sexual Activity  . Alcohol use: No  . Drug use: Yes    Types: Marijuana    Comment: about once a month  . Sexual activity: Yes    Birth control/protection: None  Lifestyle  . Physical activity:    Days per week: Not on file    Minutes per session: Not on file  . Stress: Not on file  Relationships  . Social connections:    Talks on phone: Not on file    Gets together: Not on file   Attends religious service: Not on file    Active member of club or organization: Not on file    Attends meetings of clubs or organizations: Not on file    Relationship status: Not on file  . Intimate partner violence:    Fear of current or ex partner: Not on file    Emotionally abused: Not on file    Physically abused: Not on file    Forced sexual activity: Not on file  Other Topics Concern  . Not on file  Social History Narrative  . Not on file    Review of Systems: See HPI, otherwise negative ROS   Physical Exam: BP (!) 158/84   Pulse 76   Temp 98.2 F (36.8 C)   Resp (!) 42   SpO2 100%  General:   Alert,  pleasant and cooperative in NAD Head:  Normocephalic and atraumatic. Neck:  Supple; Lungs:  Clear throughout to auscultation.    Heart:  Regular rate and rhythm. Abdomen:  Soft, nontender and nondistended. Normal bowel sounds, without guarding, and without rebound.   Neurologic:  Alert and  oriented x4;  grossly normal neurologically.  Impression/Plan:    Macrocytic anemia/HEP C/HIV-US W/ ELASTOGRAPHY SEP 2018  SUGGEST CIRRHOSIS(F3/F4 FIBROSIS). SEP 2018: PLY CT NL/Hb NL.  Plan: 1. TCS/EGD TODAY. DISCUSSED PROCEDURE, BENEFITS, & RISKS: < 1% chance of medication reaction, bleeding, perforation, or rupture of spleen/liver.

## 2017-06-28 NOTE — Op Note (Signed)
[facility] Patient Name: Noah Robinson Procedure Date: 06/28/2017 10:04 AM MRN: [id number] Date of Birth: [birth year] Attending MD: Barney Drain MD, MD CSN: [id number] Age: 72 Admit Type: Outpatient Procedure:                Upper GI endoscopy WITH COLD FORCEPS BIOPSY Indications:              Anemia-F3/F4 FIBROSIS ON ELASTOGRAPHY SEP 2018 Providers:                Barney Drain MD, MD, Rosina Lowenstein, RN, Nelma Rothman,                            Technician Referring MD:              Medicines:                Propofol per Anesthesia Complications:            No immediate complications. Estimated Blood Loss:     Estimated blood loss was minimal. Procedure:                Pre-Anesthesia Assessment:                           - Prior to the procedure, a History and Physical                            was performed, and patient medications and                            allergies were reviewed. The patient's tolerance of                            previous anesthesia was also reviewed. The risks                            and benefits of the procedure and the sedation                            options and risks were discussed with the patient.                            All questions were answered, and informed consent                            was obtained. Prior Anticoagulants: The patient has                            taken no previous anticoagulant or antiplatelet                            agents. ASA Grade Assessment: II - A patient with                            mild systemic disease. After reviewing the risks  and benefits, the patient was deemed in                            satisfactory condition to undergo the procedure.                            After obtaining informed consent, the endoscope was                            passed under direct vision. Throughout the                            procedure, the patient's blood pressure, pulse, and                      oxygen saturations were monitored continuously. The                            [id number] ([id number]) scope was introduced through the                            mouth, and advanced to the second part of duodenum.                            The upper GI endoscopy was accomplished without                            difficulty. The patient tolerated the procedure                            well. Scope In: 10:09:49 AM Scope Out: 10:17:46 AM Total Procedure Duration: 0 hours 7 minutes 57 seconds  Findings:      Three columns of non-bleeding grade I varices were found in the distal       esophagus,. They were small in size.      A moderate Schatzki ring was found at the gastroesophageal junction.      A small hiatal hernia was present.      Patchy mild inflammation characterized by congestion (edema), erosions       and erythema was found in the gastric antrum. Biopsies were taken with a       cold forceps for Helicobacter pylori testing.      Mild portal hypertensive gastropathy was found in the gastric body.      The examined duodenum was normal. Impression:               - Non-bleeding grade I esophageal varices.                           - Moderate Schatzki ring.                           - Small hiatal hernia.                           - MILD Gastritis.                           -  MILD Portal hypertensive gastropathy. Moderate Sedation:      Per Anesthesia Care Recommendation:           - Await pathology results.                           - Continue present medications.                           - High fiber diet.                           - Return to my office in 6 months. BLOOD/BODY FLUID                            PRECAUTION DISCUSSED PRIOR TO SEDATION.                           - Repeat upper endoscopy in 3 years for                            surveillance.                           - Patient has a contact number available for                             emergencies. The signs and symptoms of potential                            delayed complications were discussed with the                            patient. Return to normal activities tomorrow.                            Written discharge instructions were provided to the                            patient. Procedure Code(s):        --- Professional ---                           [phone], Esophagogastroduodenoscopy, flexible,                            transoral; with biopsy, single or multiple Diagnosis Code(s):        --- Professional ---                           I85.00, Esophageal varices without bleeding                           K22.2, Esophageal obstruction                           K44.9, Diaphragmatic hernia without obstruction or  gangrene                           K29.70, Gastritis, unspecified, without bleeding                           K76.6, Portal hypertension                           K31.89, Other diseases of stomach and duodenum                           D64.9, Anemia, unspecified CPT copyright 2017 American Medical Association. All rights reserved. The codes documented in this report are preliminary and upon coder review may  be revised to meet current compliance requirements. Barney Drain, MD Barney Drain MD, MD 06/28/2017 10:34:24 AM This report has been signed electronically. Number of Addenda: 0

## 2017-06-28 NOTE — Progress Notes (Signed)
BP 200/98, otherwise asymptomatic at discharge.  Patient is to follow up with Dr  Acquanetta Chain with [facility] in the morning at 9:30am.

## 2017-06-28 NOTE — Op Note (Signed)
[facility] Patient Name: Noah Robinson Procedure Date: 06/28/2017 9:11 AM MRN: [id number] Date of Birth: 03/05/1945 Attending MD: Barney Drain MD, MD CSN: [id number] Age: 72 Admit Type: Outpatient Procedure:                Colonoscopy WITH APC & COLD SNARE POLYPECTOMY Indications:               Providers:                Barney Drain MD, MD, Rosina Lowenstein, RN, Nelma Rothman,                            Technician Referring MD:              Medicines:                Propofol per Anesthesia Complications:            No immediate complications. Estimated Blood Loss:     Estimated blood loss was minimal. Procedure:                Pre-Anesthesia Assessment:                           - Prior to the procedure, a History and Physical                            was performed, and patient medications and                            allergies were reviewed. The patient's tolerance of                            previous anesthesia was also reviewed. The risks                            and benefits of the procedure and the sedation                            options and risks were discussed with the patient.                            All questions were answered, and informed consent                            was obtained. Prior Anticoagulants: The patient has                            taken no previous anticoagulant or antiplatelet                            agents. ASA Grade Assessment: II - A patient with                            mild systemic disease. After reviewing the risks  and benefits, the patient was deemed in                            satisfactory condition to undergo the procedure.                            After obtaining informed consent, the colonoscope                            was passed under direct vision TO THE CECUM. ACTIVE                            BLEEDING NOTED ON DIGITAL EXAM AND AFTER SCOPE WAS                            INSTERTED.  Throughout the procedure, the patient's                            blood pressure, pulse, and oxygen saturations were                            monitored continuously. The EC-[id number] ([id number])                            scope was introduced through the anus and advanced                            to the 10 cm into the ileum. The colonoscopy was                            somewhat difficult due to a tortuous colon.                            Successful completion of the procedure was aided by                            straightening and shortening the scope to obtain                            bowel loop reduction and COLOWRAP. The patient                            tolerated the procedure well. The quality of the                            bowel preparation was good. The terminal ileum,                            ileocecal valve, appendiceal orifice, and rectum                            were photographed. Scope In: 9:33:34 AM Scope Out: 10:02:53 AM Scope Withdrawal Time: 0 hours 26 minutes  29 seconds  Total Procedure Duration: 0 hours 29 minutes 19 seconds  Findings:      The terminal ileum appeared normal.      Three sessile polyps were found in the mid descending colon and distal       transverse colon. The polyps were 3 to 6 mm in size. These polyps were       removed with a cold snare. Resection and retrieval were complete.      Multiple small and large-mouthed diverticula were found in the entire       colon.      Multiple medium-sized diffuse angioectasias with bleeding were found in       the distal rectum. Coagulation for hemostasis using argon plasma at 0.8       liters/minute and 20 watts was successful.      External hemorrhoids were found during digital exam. The hemorrhoids       were moderate. Impression:               - The examined portion of the ileum was normal.                           - Three 3 to 6 mm polyps in the mid descending                            colon  and in the distal transverse colon, removed                            with a cold snare. Resected and retrieved.                           - Diverticulosis in the entire examined colon.                           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 Treated with argon                            plasma coagulation (APC).                           - External hemorrhoids. Moderate Sedation:      Per Anesthesia Care Recommendation:           - Repeat colonoscopy in 3 years for surveillance.                           - High fiber diet.                           - Continue present medications.                           - Return to my office in 6 months.                           -  Patient has a contact number available for                            emergencies. The signs and symptoms of potential                            delayed complications were discussed with the                            patient. Return to normal activities tomorrow.                            Written discharge instructions were provided to the                            patient. Procedure Code(s):        --- Professional ---                           [phone], 59, Colonoscopy, flexible; with control of                            bleeding, any method                           45385, Colonoscopy, flexible; with removal of                            tumor(s), polyp(s), or other lesion(s) by snare                            technique Diagnosis Code(s):        --- Professional ---                           K64.4, Residual hemorrhoidal skin tags                           D12.4, Benign neoplasm of descending colon                           D12.3, Benign neoplasm of transverse colon (hepatic                            flexure or splenic flexure)                           K55.21, Angiodysplasia of colon with hemorrhage                           K57.30,  Diverticulosis of large intestine without                            perforation or abscess without bleeding CPT copyright 2017 [employer]. All rights reserved. The codes documented in this report are preliminary and upon coder review may  be revised to  meet current compliance requirements. Barney Drain, MD Barney Drain MD, MD 06/28/2017 10:28:31 AM This report has been signed electronically. Number of Addenda: 0

## 2017-06-28 NOTE — Discharge Instructions (Signed)
You had 3 polyps removed. 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 I APPLIED HEAT AND MADE THEM DISAPPEAR. You have gastritis MOST LIKELY DUE TO PRIOR ALCOHOL USE.  I biopsied your stomach. 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 AVOID ITEMS THAT CAUSE BLOATING. SEE INFO BELOW.  YOUR BIOPSY RESULTS WILL BE BACK IN 7 DAYS.  FOLLOW UP IN 6 MOS.   Next colonoscopy in 3 years.   ENDOSCOPY Care After Read the instructions outlined below and refer to this sheet in the next week. These discharge instructions provide you with general information on caring for yourself after you leave the hospital. While your treatment has been planned according to the most current medical practices available, unavoidable complications occasionally occur. If you have any problems or questions after discharge, call DR. Shawnmichael Parenteau, [phone].  ACTIVITY  You may resume your regular activity, but move at a slower pace for the next 24 hours.   Take frequent rest periods for the next 24 hours.   Walking will help get rid of the air and reduce the bloated feeling in your belly (abdomen).   No driving for 24 hours (because of the medicine (anesthesia) used during the test).   You may shower.   Do not sign any important legal documents or operate any machinery for 24 hours (because of the anesthesia used during the test).    NUTRITION  Drink plenty of fluids.   You may resume your normal diet as instructed by your doctor.   Begin with a light meal and progress to your normal diet. Heavy or fried foods are harder to digest and may make you feel sick to your stomach (nauseated).   Avoid alcoholic beverages for 24 hours or as instructed.    MEDICATIONS  You may resume your normal medications.   WHAT YOU CAN EXPECT TODAY  Some feelings of bloating in the abdomen.   Passage of more gas than usual.   Spotting of blood in your stool or on  the toilet paper  .  IF YOU HAD POLYPS REMOVED DURING THE ENDOSCOPY:  Eat a soft diet IF YOU HAVE NAUSEA, BLOATING, ABDOMINAL PAIN, OR VOMITING.    FINDING OUT THE RESULTS OF YOUR TEST Not all test results are available during your visit. DR. Oneida Alar WILL CALL YOU WITHIN 14 DAYS OF YOUR PROCEDUE WITH YOUR RESULTS. Do not assume everything is normal if you have not heard from DR. Vondell Sowell IN TWO WEEKS, CALL HER OFFICE AT [phone].  SEEK IMMEDIATE MEDICAL ATTENTION AND CALL THE OFFICE: [phone] IF:  You have more than a spotting of blood in your stool.   Your belly is swollen (abdominal distention).   You are nauseated or vomiting.   You have a temperature over 101F.   You have abdominal pain or discomfort that is severe or gets worse throughout the day.  Arteriovenous Malformation An arteriovenous malformation (AVM) is a disorder that has been present since birth (congenital). It is characterized by a complex, tangled web of arteries and veins. An AVM may occur in the STOMACH, COLON, OR SMALL BOWEL.  SYMPTOMS  The most common problems (symptoms) of AVM include:  Bleeding (hemorrhaging).   ANEMIA  TREATMENT  There are three general forms of treatment for AVM: **ABLATION WITH HEAT   Gastritis  Gastritis is an inflammation (the body's way of reacting to injury and/or infection) of the stomach. It is often caused  by viral or bacterial (germ) infections. It can also be caused BY ALCOHOL, ASPIRIN, BC/GOODY POWDER'S, (IBUPROFEN) MOTRIN, OR ALEVE (NAPROXEN), chemicals (including alcohol), SPICY FOODS, and medications. This illness may be associated with generalized malaise (feeling tired, not well), UPPER ABDOMINAL STOMACH cramps, and fever. One common bacterial cause of gastritis is an organism known as H. Pylori. This can be treated with antibiotics.    High-Fiber Diet A high-fiber diet changes your normal diet to include more whole grains, legumes, fruits, and vegetables.  Changes in the diet involve replacing refined carbohydrates with unrefined foods. The calorie level of the diet is essentially unchanged. The Dietary Reference Intake (recommended amount) for adult males is 38 grams per day. For adult females, it is 25 grams per day. Pregnant and lactating women should consume 28 grams of fiber per day. Fiber is the intact part of a plant that is not broken down during digestion. Functional fiber is fiber that has been isolated from the plant to provide a beneficial effect in the body. PURPOSE  Increase stool bulk.   Ease and regulate bowel movements.   Lower cholesterol.   REDUCE RISK OF COLON CANCER  INDICATIONS THAT YOU NEED MORE FIBER  Constipation and hemorrhoids.   Uncomplicated diverticulosis (intestine condition) and irritable bowel syndrome.   Weight management.   As a protective measure against hardening of the arteries (atherosclerosis), diabetes, and cancer.   GUIDELINES FOR INCREASING FIBER IN THE DIET  Start adding fiber to the diet slowly. A gradual increase of about 5 more grams (2 slices of whole-wheat bread, 2 servings of most fruits or vegetables, or 1 bowl of high-fiber cereal) per day is best. Too rapid an increase in fiber may result in constipation, flatulence, and bloating.   Drink enough water and fluids to keep your urine clear or pale yellow. Water, juice, or caffeine-free drinks are recommended. Not drinking enough fluid may cause constipation.   Eat a variety of high-fiber foods rather than one type of fiber.   Try to increase your intake of fiber through using high-fiber foods rather than fiber pills or supplements that contain small amounts of fiber.   The goal is to change the types of food eaten. Do not supplement your present diet with high-fiber foods, but replace foods in your present diet.   INCLUDE A VARIETY OF FIBER SOURCES  Replace refined and processed grains with whole grains, canned fruits with fresh  fruits, and incorporate other fiber sources. White rice, white breads, and most bakery goods contain little or no fiber.   Brown whole-grain rice, buckwheat oats, and many fruits and vegetables are all good sources of fiber. These include: broccoli, Brussels sprouts, cabbage, cauliflower, beets, sweet potatoes, white potatoes (skin on), carrots, tomatoes, eggplant, squash, berries, fresh fruits, and dried fruits.   Cereals appear to be the richest source of fiber. Cereal fiber is found in whole grains and bran. Bran is the fiber-rich outer coat of cereal grain, which is largely removed in refining. In whole-grain cereals, the bran remains. In breakfast cereals, the largest amount of fiber is found in those with "bran" in their names. The fiber content is sometimes indicated on the label.   You may need to include additional fruits and vegetables each day.   In baking, for 1 cup white flour, you may use the following substitutions:   1 cup whole-wheat flour minus 2 tablespoons.   1/2 cup white flour plus 1/2 cup whole-wheat flour.  Polyps, Colon  A polyp is  extra tissue that grows inside your body. Colon polyps grow in the large intestine. The large intestine, also called the colon, is part of your digestive system. It is a long, hollow tube at the end of your digestive tract where your body makes and stores stool. Most polyps are not dangerous. They are benign. This means they are not cancerous. But over time, some types of polyps can turn into cancer. Polyps that are smaller than a pea are usually not harmful. But larger polyps could someday become or may already be cancerous. To be safe, doctors remove all polyps and test them.   WHO GETS POLYPS? Anyone can get polyps, but certain people are more likely than others. You may have a greater chance of getting polyps if:  You are over 50.   You have had polyps before.   Someone in your family has had polyps.   Someone in your family has had  cancer of the large intestine.   Find out if someone in your family has had polyps. You may also be more likely to get polyps if you:   Eat a lot of fatty foods   Smoke   Drink alcohol   Do not exercise  Eat too much     PREVENTION There is not one sure way to prevent polyps. You might be able to lower your risk of getting them if you:  Eat more fruits and vegetables and less fatty food.   Do not smoke.   Avoid alcohol.   Exercise every day.   Lose weight if you are overweight.   Eating more calcium and folate can also lower your risk of getting polyps. Some foods that are rich in calcium are milk, cheese, and broccoli. Some foods that are rich in folate are chickpeas, kidney beans, and spinach.    Hemorrhoids Hemorrhoids are dilated (enlarged) veins around the rectum. Sometimes clots will form in the veins. This makes them swollen and painful. These are called thrombosed hemorrhoids. Causes of hemorrhoids include:  Constipation.   Straining to have a bowel movement.   HEAVY LIFTING HOME CARE INSTRUCTIONS  Eat a well balanced diet and drink 6 to 8 glasses of water every day to avoid constipation. You may also use a bulk laxative.   Avoid straining to have bowel movements.   Keep anal area dry and clean.   Do not use a donut shaped pillow or sit on the toilet for long periods. This increases blood pooling and pain.   Move your bowels when your body has the urge; this will require less straining and will decrease pain and pressure.

## 2017-06-29 LAB — FOLATE RBC
FOLATE, HEMOLYSATE: 396.3 ng/mL
Folate, RBC: 1325 ng/mL (ref >498)
HEMATOCRIT: 29.9 % — AB (ref 37.5–51.0)

## 2017-06-30 ENCOUNTER — Encounter (HOSPITAL_COMMUNITY): Payer: Self-pay | Admitting: Gastroenterology

## 2017-07-05 ENCOUNTER — Telehealth: Payer: Self-pay | Admitting: Gastroenterology

## 2017-07-05 ENCOUNTER — Ambulatory Visit: Payer: Medicare Other | Admitting: Nurse Practitioner

## 2017-07-05 NOTE — Telephone Encounter (Signed)
[phone] PLEASE CALL PATIENT, HE HAS SOME QUESTIONS

## 2017-07-05 NOTE — Telephone Encounter (Signed)
Tried to call, VM not set up.

## 2017-07-06 NOTE — Telephone Encounter (Signed)
Called, many rings. VM not set up.

## 2017-07-07 ENCOUNTER — Telehealth: Payer: Self-pay | Admitting: Gastroenterology

## 2017-07-07 ENCOUNTER — Encounter (HOSPITAL_COMMUNITY): Payer: Self-pay | Admitting: Gastroenterology

## 2017-07-07 MED ORDER — CLARITHROMYCIN 500 MG PO TABS: ORAL_TABLET | ORAL | 0 refills | Status: DC

## 2017-07-07 MED ORDER — PANTOPRAZOLE SODIUM 40 MG PO TBEC: DELAYED_RELEASE_TABLET | ORAL | 11 refills | Status: DC

## 2017-07-07 MED ORDER — AMOXICILLIN 500 MG PO TABS: ORAL_TABLET | ORAL | 0 refills | Status: DC

## 2017-07-07 NOTE — Telephone Encounter (Signed)
Tried to call, Vm not set up yet. Mailing a letter to call.

## 2017-07-07 NOTE — Telephone Encounter (Signed)
PLEASE CALL PT. HIS stomach Bx showed H. Pylori infection. He needs AMOXICILLIN 500 mg 2 po BID for 10 days and Biaxin 500 mg po bid for 10 days. START PROTONIX TWICE DAILY FOR ONE MONTH THEN ONCE DAILY FOR 2 MOS. Med side effects include NVD, abd pain, and metallic taste.   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   THREE SIMPLE ADENOMA POLYPS REMOVED. NEXT COLONOSCOPY IN 3 YEARS.

## 2017-07-27 ENCOUNTER — Encounter

## 2017-07-27 ENCOUNTER — Ambulatory Visit: Payer: Medicare Other | Admitting: Nurse Practitioner

## 2017-07-27 ENCOUNTER — Encounter: Payer: Self-pay | Admitting: Nurse Practitioner

## 2017-07-27 VITALS — BP 140/66 | HR 71 | Temp 98.1°F | Ht 69.0 in | Wt 149.2 lb

## 2017-07-27 DIAGNOSIS — A048 Other specified bacterial intestinal infections: Secondary | ICD-10-CM

## 2017-07-27 DIAGNOSIS — B182 Chronic viral hepatitis C: Secondary | ICD-10-CM | POA: Diagnosis not present

## 2017-07-27 DIAGNOSIS — K625 Hemorrhage of anus and rectum: Secondary | ICD-10-CM | POA: Diagnosis not present

## 2017-07-27 DIAGNOSIS — B20 Human immunodeficiency virus [HIV] disease: Secondary | ICD-10-CM

## 2017-07-27 DIAGNOSIS — K7460 Unspecified cirrhosis of liver: Secondary | ICD-10-CM

## 2017-07-27 NOTE — Patient Instructions (Addendum)
1. Initial all of your antibiotics. 2. It is very important that you call infectious disease so that she can be treated for ear infections. 3. Have your labs drawn when you are able to.  It is very important to have this done. 4. We will schedule your ultrasound of your liver.  It is very important to have this done. 5. We will give you the phone number for infectious disease see you can call them to schedule an appointment. 6. Start taking Colace stool softener, available over-the-counter, once a day. 7. You can continue to take prune juice as needed as well. 8. Call us if you have any questions or concerns.  At [facility] we value your feedback. You may receive a survey about your visit today. Please share your experience as we strive to create trusting relationships with our patients to provide genuine, compassionate, quality care.  It was good to see you today.  I hope you have a great summer!

## 2017-07-27 NOTE — Progress Notes (Signed)
Referring Provider: Barry Dienes, NP Primary Care Physician:  Barry Dienes, NP Primary GI:  Dr. [place] Alar  Chief Complaint  Patient presents with  . Hepatitis C    f/u  . Rectal Bleeding    only when he strains with BM, soreness back there    HPI:   Noah Robinson is a 72 y.o. male who presents for hepatitis C, HIV, rectal bleeding.  The patient was last seen in our office 05/02/2017 for chronic hepatitis C, HIV, anemia, cirrhosis.  At that time it was noted that he had positive hepatitis C and liver fibrosis F3 and F4.  He also tested positive for HIV.  We recommended referral to infectious disease for evaluation and treatment of coinfected hepatitis C and HIV.  At his last visit he stated he was called by infectious disease but held off "until I can see you guys for HCV."  He was educated that we would not be treating him for hepatitis C, that this would need to be completed by infectious disease given that he has HIV as well.  He denied any GI or hepatic symptoms.  Not currently on any medications at that time.  Recommended follow-up labs, phone number provided to infectious disease and recommended he call them for an ASAP appointment, colonoscopy and upper endoscopy, follow-up in 2 months.  Colonoscopy was completed 06/28/2017 which found three sessile polyps in the mid descending colon, distal transverse colon measuring 3 to 6 mm in size.  Multiple large mouth diverticula in the colon.  Normocytic anemia likely due to multiple bleeding colonic angioid ectasias treated with argon plasma coagulation.  Surgical pathology found the polyps to be tubular adenoma.  Recommended repeat colonoscopy in 3 years, follow-up in 6 months.    EGD completed the same day found 3 columns of nonbleeding grade 1 esophageal varices, moderate Schatzki's ring, small hiatal hernia, mild portal hypertensive gastropathy, mild gastritis.  Surgical pathology found biopsies to be gastric antral and oxyntic mucosa with positive  H. pylori.  Recommended continue current medications, follow-up in 6 months, repeat EGD in 3 years.  Amended 07/07/2017 recommended treatment of H. pylori with amoxicillin 500 mg 2 p.o. twice daily for 10 days, Biaxin 500 mg p.o. twice daily for 10 days, Protonix twice daily for 1 month then once daily for 2 months.  We were unable to reach the patient by phone and a letter was sent to him.  Today he states he's ok overall. He states he never received the letter about H. Pylori. Has been having constipation intermittently depending on what he eats. Has a lot of flatuance. He takes colace as needed, drinks Prune juice. This helps prevent constipation. He does have known hemorrhoids. Sees blood if he has to strain (only on toilet tissue). He gave me his medications and no colace is in there. He does have his antibiotics and started taking them a week ago. He states he can taste them when he takes them. Apparently he is not on colace. Denies abdominal pain, N/V, fever, chills. He states (subjectively) he has lost about 10 lbs in one month. Ojectively he is down 9 lbs in 3 months. (He does still have active HCV and HIV). Denies yellowing of skin/eyes, darkened urine, acute episodic confusion, tremors, LE or abdominal swelling, generalized pruritis.   He has neither seen nor called ID. He states he was waiting "to get over all of this before calling them." I again stressed the importance of being seen/treated by  ID.  Past Medical History:  Diagnosis Date  . Cirrhosis ([place])   . Colon cancer ([place])    per patient recollection  . Enlarged prostate   . HCV (hepatitis C virus)   . HIV (human immunodeficiency virus infection) ([place])     Past Surgical History:  Procedure Laterality Date  . BIOPSY  06/28/2017   Procedure: BIOPSY;  Surgeon: Danie Binder, MD;  [facility];  Service: Endoscopy;;  gastric bx's  . COLONOSCOPY     about 10 years ago  . COLONOSCOPY WITH PROPOFOL N/A 06/28/2017    Procedure: COLONOSCOPY WITH PROPOFOL;  Surgeon: Danie Binder, MD;  [facility];  Service: Endoscopy;  Laterality: N/A;  9:15am  . ESOPHAGOGASTRODUODENOSCOPY (EGD) WITH PROPOFOL N/A 06/28/2017   Procedure: ESOPHAGOGASTRODUODENOSCOPY (EGD) WITH PROPOFOL;  Surgeon: Danie Binder, MD;  [facility];  Service: Endoscopy;  Laterality: N/A;  . POLYPECTOMY  06/28/2017   Procedure: POLYPECTOMY;  Surgeon: Danie Binder, MD;  [facility];  Service: Endoscopy;;  transverse colon polyps x2, descending colon polyp    Current Outpatient Medications  Medication Sig Dispense Refill  . amoxicillin (AMOXIL) 500 MG tablet 2 PO BID FOR 10 DAYS 40 tablet 0  . clarithromycin (BIAXIN) 500 MG tablet 1 PO BID FOR 10 DAYS. 20 tablet 0  . pantoprazole (PROTONIX) 40 MG tablet 1 PO 30 MINUTES PRIOR TO MEALS BID FOR 3 MOS THEN QD (Patient taking differently: 1 PO 30 MINUTES PRIOR TO MEALS BID) 60 tablet 11   No current facility-administered medications for this visit.     Allergies as of 07/27/2017  . (No Known Allergies)    Family History  Problem Relation Age of Onset  . Colon cancer Neg Hx     Social History   Socioeconomic History  . Marital status: Divorced    Spouse name: Not on file  . Number of children: Not on file  . Years of education: Not on file  . Highest education level: Not on file  Occupational History  . Not on file  Social Needs  . Financial resource strain: Not on file  . Food insecurity:    Worry: Not on file    Inability: Not on file  . Transportation needs:    Medical: Not on file    Non-medical: Not on file  Tobacco Use  . Smoking status: Never Smoker  . Smokeless tobacco: Never Used  Substance and Sexual Activity  . Alcohol use: No  . Drug use: Yes    Types: Marijuana    Comment: As of 07/27/17: No use in 3 weeks; previously about once a month  . Sexual activity: Yes    Birth control/protection: None  Lifestyle  . Physical  activity:    Days per week: Not on file    Minutes per session: Not on file  . Stress: Not on file  Relationships  . Social connections:    Talks on phone: Not on file    Gets together: Not on file    Attends religious service: Not on file    Active member of club or organization: Not on file    Attends meetings of clubs or organizations: Not on file    Relationship status: Not on file  Other Topics Concern  . Not on file  Social History Narrative  . Not on file    Review of Systems: Complete ROS negative except as per HPI.   Physical Exam: BP  140/66   Pulse 71   Temp 98.1 F (36.7 C) (Oral)   Ht 5\' 9"  (1.753 m)   Wt 149 lb 3.2 oz (67.7 kg)   BMI 22.03 kg/m  General:   Alert and oriented. Pleasant and cooperative. Well-nourished and well-developed.  Eyes:  Without icterus, sclera clear and conjunctiva pink.  Ears:  Normal auditory acuity. Cardiovascular:  S1, S2 present without murmurs appreciated. Extremities without clubbing or edema. Respiratory:  Clear to auscultation bilaterally. No wheezes, rales, or rhonchi. No distress.  Gastrointestinal:  +BS, soft, non-tender and non-distended. No HSM noted. No guarding or rebound. No masses appreciated.  Rectal:  Deferred  Musculoskalatal:  Symmetrical without gross deformities. Neurologic:  Alert and oriented x4;  grossly normal neurologically. Psych:  Alert and cooperative. Normal mood and affect. Heme/Lymph/Immune: No excessive bruising noted.    07/27/2017 9:37 AM   Disclaimer: This note was dictated with voice recognition software. Similar sounding words can inadvertently be transcribed and may not be corrected upon review.

## 2017-07-27 NOTE — Progress Notes (Signed)
CC'ED TO PCP 

## 2017-07-27 NOTE — Assessment & Plan Note (Signed)
F3/F4 hepatic cirrhosis in the setting of chronic hepatitis C.  He did not complete his labs at last office visit.  He did not complete his abdominal imaging.  We will reorder his labs and imaging today.  This is reinforcing his beginning patterns of noncompliance related to treatment and surveillance.  Did have esophageal varices on his EGD and recommended 3-year repeat exam.  He is on the recall list for this.  He is generally asymptomatic from a hepatic standpoint.  Follow-up in 6 months.

## 2017-07-27 NOTE — Assessment & Plan Note (Signed)
Chronic hepatitis C co-infected with HIV.  We are unable to treat him here.  He is yet to see infectious disease as recommended.  He states he wants to "get over this right now and then I can call them."  He seems to be making excuses at each point for why he needs to hold off on seeing infectious disease.  I have again reinforced the need to see them as soon as possible for treatment of HIV and hepatitis C.  He is beginning to show patterns of noncompliance which could be detrimental to his health.  Return for follow-up in 6 months.  We will again give him the phone number for infectious disease, per his request.

## 2017-07-27 NOTE — Assessment & Plan Note (Signed)
Overall his rectal bleeding is improved since [facility] treatment of angioid ectasias.  He does have mild toilet tissue hematochezia when he has straining and constipation.  He is not on anything other than prune-juice currently.  I recommend he take a daily stool softener in addition to his prune juice to prevent constipation.  Follow-up in 6 months.  Call if any worsening problems.

## 2017-07-27 NOTE — Assessment & Plan Note (Signed)
Positive H. pylori infection on EGD biopsies.  The patient just received his antibiotics a week ago.  He has begun taking them.  I recommend he take all of his antibiotics to completion as well as PPI twice a day for 2 months and then once a day after that as previously indicated.  We will see him back in the office in 6 months.  At that point we can hold his PPI and check for eradication.  Follow-up in 6 months.

## 2017-08-01 ENCOUNTER — Ambulatory Visit (HOSPITAL_COMMUNITY): Payer: Medicare Other

## 2017-09-22 ENCOUNTER — Ambulatory Visit: Payer: Medicare Other | Admitting: Nurse Practitioner

## 2018-01-31 ENCOUNTER — Ambulatory Visit (INDEPENDENT_AMBULATORY_CARE_PROVIDER_SITE_OTHER): Payer: Medicare Other | Admitting: Nurse Practitioner

## 2018-01-31 ENCOUNTER — Encounter: Payer: Self-pay | Admitting: Nurse Practitioner

## 2018-01-31 VITALS — BP 150/75 | HR 85 | Temp 98.0°F | Ht 69.0 in | Wt 149.6 lb

## 2018-01-31 DIAGNOSIS — B182 Chronic viral hepatitis C: Secondary | ICD-10-CM | POA: Diagnosis not present

## 2018-01-31 DIAGNOSIS — K625 Hemorrhage of anus and rectum: Secondary | ICD-10-CM | POA: Diagnosis not present

## 2018-01-31 DIAGNOSIS — B20 Human immunodeficiency virus [HIV] disease: Secondary | ICD-10-CM | POA: Diagnosis not present

## 2018-01-31 DIAGNOSIS — A048 Other specified bacterial intestinal infections: Secondary | ICD-10-CM

## 2018-01-31 DIAGNOSIS — K7460 Unspecified cirrhosis of liver: Secondary | ICD-10-CM | POA: Diagnosis not present

## 2018-01-31 NOTE — Patient Instructions (Signed)
1. We will have you sign a records release and we can transfer your records to [place], [place] with Dr. Scharlene Gloss 2. Call us if you have any questions or concerns. 3. Follow-up with Dr. Scharlene Gloss for hepatitis C, constipation, cirrhosis. 4. As we have previously discussed it is imperative that she see infectious disease to get treated  At [facility] we value your feedback. You may receive a survey about your visit today. Please share your experience as we strive to create trusting relationships with our patients to provide genuine, compassionate, quality care.  We appreciate your understanding and patience as we review any laboratory studies, imaging, and other diagnostic tests that are ordered as we care for you. Our office policy is 5 business days for review of these results, and any emergent or urgent results are addressed in a timely manner for your best interest. If you do not hear from our office in 1 week, please contact us.   We also encourage the use of MyChart, which contains your medical information for your review as well. If you are not enrolled in this feature, an access code is on this after visit summary for your convenience. Thank you for allowing [place] to be involved in your care.  It was great to see you today!  I hope you have a Merry Christmas!!

## 2018-01-31 NOTE — Assessment & Plan Note (Signed)
He was found to be HIV positive when he was being worked up for hepatitis C treatment.  It is been about a year since we first recommended he see infectious disease for treatment of co-infected HIV/hepatitis C.  He states he is waiting for infectious disease in [place] to make him an appointment.  He has been pretty noncompliant in this whole process.  Recommend he see infectious disease as soon as he can.

## 2018-01-31 NOTE — Assessment & Plan Note (Signed)
The patient has chronic cirrhosis likely from hepatitis C.  Waiting for infectious disease to treat him because of co-infected HIV.  He did not have his labs or imaging completed as recommended at his last visit.  Today he states he last saw Dr. Scharlene Gloss (gastroenterology in [place], [place]).  About a little over a month ago.  It appears he is seen to gastroenterology practices.  He indicates [place], [place] is closer for him then [place], [place].  We will have him sign a records release and transfer his care to [place], [place].

## 2018-01-31 NOTE — Progress Notes (Signed)
cc'ed to pcp °

## 2018-01-31 NOTE — Assessment & Plan Note (Signed)
Chronic hepatitis C needing treatment by infectious disease due to co-infected with HIV.  He has finally contacted infectious disease in [place], [place] and is waiting for them to make him an appointment.  I recommended that he follow-up with this.  As per below, he seems to be seeing to gastroenterology practices.  He indicates [place], [place] is closer and we will have him sign a records release for transfer his care there.

## 2018-01-31 NOTE — Assessment & Plan Note (Signed)
Previously diagnosed H. pylori infection status post treatment.  He is due for eradication testing.  We will defer this to gastroenterology in [place], [place] which the patient states is closer to him and he has been seeing.

## 2018-01-31 NOTE — Assessment & Plan Note (Signed)
He has scant toilet tissue hematochezia in the setting of straining and constipation.  We previously recommended he take Colace once daily to see if this would help.  He was at one point taking Colace as needed and prune juice as needed.  He cannot remember if he is taking a medication for constipation and if he is, what it is.  I will again recommend he take Colace daily, prune juice as needed.  If this is effective this would likely be all he needs.  Further management per [place], [place] gastroenterology based on his response.  Colonoscopy is up-to-date as per HPI.

## 2018-01-31 NOTE — Progress Notes (Signed)
Referring Provider: Barry Dienes, NP Primary Care Physician:  Barry Dienes, NP Primary GI:  Dr. [place] Alar  Chief Complaint  Patient presents with  . Constipation  . Hepatitis C    states he's waiting fo appt w/infectious disease in [place]    HPI:   Noah Robinson is a 72 y.o. male who presents for follow-up on hepatitis C in the setting of HIV as well as cirrhosis.  The patient was last seen in our office 07/27/2017 for HIV, H. pylori, chronic hepatitis C, cirrhosis, rectal bleeding.  History of positive for hepatitis C with liver fibrosis F3 and F4.  Tested positive for HIV and was referred to infectious disease for evaluation and treatment of co-infected hepatitis C and HIV.  He deferred being seen by them until he could see our office for treatment of hepatitis C.  He was educated that we would not be treating him for hepatitis C and this would need to be completed by infectious disease.  Last colonoscopy 06/28/2017 with diverticula and 3 sessile polyps found to be tubular adenoma and recommended repeat colonoscopy in 3 years (2022).  EGD completed 06/28/2017 found 3 columns of nonbleeding grade 1 esophageal varices and mild portal hypertensive gastropathy, mild gastritis found to be positive for H. pylori.  Recommended repeat EGD in 3 years as well.  Recommended triple therapy for H. pylori treatment although we could not reach the patient we sent a letter.  At his last visit he was doing okay, never received a letter about H. pylori.  He complained of a lot of constipation, takes Colace as needed and drinks prune juice.  History of known hemorrhoids and sees scant toilet tissue hematochezia with straining.  He started his antibiotics a week prior.  Subjective weight loss of 10 pounds in 1 month with subjective weight showing he is down 9 pounds in 3 months, in the setting of active HCV and HIV.  No other hepatic or GI symptoms.  He still stated he had not seen or called ID and was waiting to "get  over all of this before calling them."  It was again stressed the importance of being seen and treated by infectious disease.  Recommended he finish his antibiotic course, follow-up with infectious disease, updated labs, take Colace once daily and prune juice as needed.  It does not appear he has had his labs drawn.  It does not appear that he followed up with his scheduled right upper quadrant ultrasound.  Today he states he has called ID at [facility] and is waiting for them to make him an appointment. Denies abdominal pain, N/V. Has constipation and occasionally has straining and will have intermittent scant toilet tissue hematochezia with that. Isn't sure if he's taking Colace daily, asks if it's the "little pill with the high dose and low dose". Not sure if he's taking anything. Needs a refill of his PPI. Denies yellowing of skin/eyes, darkened urine, tremors/shakes, acute episodic confusion. Denies chest pain, dyspnea, dizziness, lightheadedness, syncope, near syncope. Denies any other upper or lower GI symptoms.  States he completed H. Pylori medications; still on PPI.  Past Medical History:  Diagnosis Date  . Cirrhosis ([place])   . Colon cancer ([place])    per patient recollection  . Enlarged prostate   . HCV (hepatitis C virus)   . HIV (human immunodeficiency virus infection) ([place])     Past Surgical History:  Procedure Laterality Date  . BIOPSY  06/28/2017   Procedure: BIOPSY;  Surgeon: Danie Binder, MD;  [facility];  Service: Endoscopy;;  gastric bx's  . COLONOSCOPY     about 10 years ago  . COLONOSCOPY WITH PROPOFOL N/A 06/28/2017   Procedure: COLONOSCOPY WITH PROPOFOL;  Surgeon: Danie Binder, MD;  [facility];  Service: Endoscopy;  Laterality: N/A;  9:15am  . ESOPHAGOGASTRODUODENOSCOPY (EGD) WITH PROPOFOL N/A 06/28/2017   Procedure: ESOPHAGOGASTRODUODENOSCOPY (EGD) WITH PROPOFOL;  Surgeon: Danie Binder, MD;  [facility];  Service: Endoscopy;   Laterality: N/A;  . POLYPECTOMY  06/28/2017   Procedure: POLYPECTOMY;  Surgeon: Danie Binder, MD;  [facility];  Service: Endoscopy;;  transverse colon polyps x2, descending colon polyp    Current Outpatient Medications  Medication Sig Dispense Refill  . FEROSUL 325 (65 Fe) MG tablet Take 1 tablet by mouth daily at 12 noon.  1  . pantoprazole (PROTONIX) 40 MG tablet 1 PO 30 MINUTES PRIOR TO MEALS BID FOR 3 MOS THEN QD (Patient taking differently: 1 PO 30 MINUTES PRIOR TO MEALS BID) 60 tablet 11   No current facility-administered medications for this visit.     Allergies as of 01/31/2018  . (No Known Allergies)    Family History  Problem Relation Age of Onset  . Colon cancer Neg Hx     Social History   Socioeconomic History  . Marital status: Divorced    Spouse name: Not on file  . Number of children: Not on file  . Years of education: Not on file  . Highest education level: Not on file  Occupational History  . Not on file  Social Needs  . Financial resource strain: Not on file  . Food insecurity:    Worry: Not on file    Inability: Not on file  . Transportation needs:    Medical: Not on file    Non-medical: Not on file  Tobacco Use  . Smoking status: Never Smoker  . Smokeless tobacco: Never Used  Substance and Sexual Activity  . Alcohol use: No  . Drug use: Not Currently    Types: Marijuana    Comment: 01/31/18-states he quit all that  . Sexual activity: Yes    Birth control/protection: None  Lifestyle  . Physical activity:    Days per week: Not on file    Minutes per session: Not on file  . Stress: Not on file  Relationships  . Social connections:    Talks on phone: Not on file    Gets together: Not on file    Attends religious service: Not on file    Active member of club or organization: Not on file    Attends meetings of clubs or organizations: Not on file    Relationship status: Not on file  Other Topics Concern  . Not on file  Social  History Narrative  . Not on file    Review of Systems: Complete ROS negative except as per HPI.  Physical Exam: BP (!) 150/75   Pulse 85   Temp 98 F (36.7 C) (Oral)   Ht 5\' 9"  (1.753 m)   Wt 149 lb 9.6 oz (67.9 kg)   BMI 22.09 kg/m  General:   Alert and oriented. Pleasant and cooperative. Well-nourished and well-developed.  Eyes:  Without icterus, sclera clear and conjunctiva pink.  Ears:  Normal auditory acuity. Cardiovascular:  S1, S2 present without murmurs appreciated. Extremities without clubbing or edema. Respiratory:  Clear to auscultation bilaterally. No wheezes, rales,  or rhonchi. No distress.  Gastrointestinal:  +BS, soft, non-tender and non-distended. No HSM noted. No guarding or rebound. No masses appreciated.  Rectal:  Deferred  Musculoskalatal:  Symmetrical without gross deformities. Neurologic:  Alert and oriented x4;  grossly normal neurologically. Psych:  Alert and cooperative. Normal mood and affect. Heme/Lymph/Immune: No excessive bruising noted.    01/31/2018 9:56 AM   Disclaimer: This note was dictated with voice recognition software. Similar sounding words can inadvertently be transcribed and may not be corrected upon review.

## 2019-08-15 ENCOUNTER — Other Ambulatory Visit: Payer: Medicare Other

## 2019-08-15 ENCOUNTER — Other Ambulatory Visit (HOSPITAL_COMMUNITY)
Admission: RE | Admit: 2019-08-15 | Discharge: 2019-08-15 | Disposition: A | Discharge status: Home / Self Care | Payer: Medicare Other | Source: Ambulatory Visit | Attending: Infectious Diseases | Admitting: Infectious Diseases

## 2019-08-15 ENCOUNTER — Other Ambulatory Visit: Payer: Self-pay | Admitting: *Deleted

## 2019-08-15 ENCOUNTER — Other Ambulatory Visit: Payer: Self-pay

## 2019-08-15 ENCOUNTER — Encounter: Payer: Self-pay | Admitting: Infectious Diseases

## 2019-08-15 ENCOUNTER — Ambulatory Visit: Payer: Medicare Other

## 2019-08-15 DIAGNOSIS — Z113 Encounter for screening for infections with a predominantly sexual mode of transmission: Secondary | ICD-10-CM | POA: Diagnosis present

## 2019-08-15 DIAGNOSIS — B20 Human immunodeficiency virus [HIV] disease: Secondary | ICD-10-CM | POA: Diagnosis present

## 2019-08-15 DIAGNOSIS — Z79899 Other long term (current) drug therapy: Secondary | ICD-10-CM

## 2019-08-15 DIAGNOSIS — B1920 Unspecified viral hepatitis C without hepatic coma: Secondary | ICD-10-CM

## 2019-08-16 LAB — URINALYSIS
Bilirubin Urine: NEGATIVE
Glucose, UA: NEGATIVE
Hgb urine dipstick: NEGATIVE
Ketones, ur: NEGATIVE
Leukocytes,Ua: NEGATIVE
Nitrite: NEGATIVE
Specific Gravity, Urine: 1.012 (ref 1.001–1.03)
pH: 6.5 (ref 5.0–8.0)

## 2019-08-16 LAB — T-HELPER CELL (CD4) - (RCID CLINIC ONLY)
CD4 % Helper T Cell: 31 % — ABNORMAL LOW (ref 33–65)
CD4 T Cell Abs: 310 /uL — ABNORMAL LOW (ref 400–1790)

## 2019-08-17 LAB — URINE CYTOLOGY ANCILLARY ONLY
Chlamydia: NEGATIVE
Comment: NEGATIVE
Comment: NORMAL
Neisseria Gonorrhea: NEGATIVE

## 2019-08-23 LAB — LIPID PANEL
Cholesterol: 138 mg/dL (ref <200)
HDL: 38 mg/dL — ABNORMAL LOW (ref >=40)
LDL Cholesterol (Calc): 70 mg/dL (calc)
Non-HDL Cholesterol (Calc): 100 mg/dL (calc) (ref <130)
Total CHOL/HDL Ratio: 3.6 (calc) (ref <5.0)
Triglycerides: 206 mg/dL — ABNORMAL HIGH (ref <150)

## 2019-08-23 LAB — CBC WITH DIFFERENTIAL/PLATELET
Absolute Monocytes: 578 cells/uL (ref 200–950)
Basophils Absolute: 9 cells/uL (ref 0–200)
Basophils Relative: 0.2 %
Eosinophils Absolute: 80 cells/uL (ref 15–500)
Eosinophils Relative: 1.7 %
HCT: 29.6 % — ABNORMAL LOW (ref 38.5–50.0)
Hemoglobin: 9.7 g/dL — ABNORMAL LOW (ref 13.2–17.1)
Lymphs Abs: 992 cells/uL (ref 850–3900)
MCH: 29.8 pg (ref 27.0–33.0)
MCHC: 32.8 g/dL (ref 32.0–36.0)
MCV: 91.1 fL (ref 80.0–100.0)
MPV: 10.5 fL (ref 7.5–12.5)
Monocytes Relative: 12.3 %
Neutro Abs: 3041 cells/uL (ref 1500–7800)
Neutrophils Relative %: 64.7 %
Platelets: 129 10*3/uL — ABNORMAL LOW (ref 140–400)
RBC: 3.25 10*6/uL — ABNORMAL LOW (ref 4.20–5.80)
RDW: 13.3 % (ref 11.0–15.0)
Total Lymphocyte: 21.1 %
WBC: 4.7 10*3/uL (ref 3.8–10.8)

## 2019-08-23 LAB — COMPLETE METABOLIC PANEL WITH GFR
AG Ratio: 0.4 (calc) — ABNORMAL LOW (ref 1.0–2.5)
ALT: 58 U/L — ABNORMAL HIGH (ref 9–46)
AST: 84 U/L — ABNORMAL HIGH (ref 10–35)
Albumin: 2.9 g/dL — ABNORMAL LOW (ref 3.6–5.1)
Alkaline phosphatase (APISO): 74 U/L (ref 35–144)
BUN/Creatinine Ratio: 19 (calc) (ref 6–22)
BUN: 24 mg/dL (ref 7–25)
CO2: 23 mmol/L (ref 20–32)
Calcium: 8.7 mg/dL (ref 8.6–10.3)
Chloride: 101 mmol/L (ref 98–110)
Creat: 1.28 mg/dL — ABNORMAL HIGH (ref 0.70–1.18)
GFR, Est African American: 64 mL/min/{1.73_m2} (ref >=60)
GFR, Est Non African American: 56 mL/min/{1.73_m2} — ABNORMAL LOW (ref >=60)
Globulin: 6.9 g/dL (calc) — ABNORMAL HIGH (ref 1.9–3.7)
Glucose, Bld: 92 mg/dL (ref 65–99)
Potassium: 4.5 mmol/L (ref 3.5–5.3)
Sodium: 132 mmol/L — ABNORMAL LOW (ref 135–146)
Total Bilirubin: 0.5 mg/dL (ref 0.2–1.2)
Total Protein: 9.8 g/dL — ABNORMAL HIGH (ref 6.1–8.1)

## 2019-08-23 LAB — HEPATITIS B CORE ANTIBODY, TOTAL: Hep B Core Total Ab: NONREACTIVE

## 2019-08-23 LAB — HIV ANTIBODY (ROUTINE TESTING W REFLEX): HIV 1&2 Ab, 4th Generation: REACTIVE — AB

## 2019-08-23 LAB — RPR: RPR Ser Ql: NONREACTIVE

## 2019-08-23 LAB — HEPATITIS C ANTIBODY
Hepatitis C Ab: REACTIVE — AB
SIGNAL TO CUT-OFF: 26.8 — ABNORMAL HIGH (ref <1.00)

## 2019-08-23 LAB — HLA B*5701: HLA-B*5701 w/rflx HLA-B High: NEGATIVE

## 2019-08-23 LAB — QUANTIFERON-TB GOLD PLUS
Mitogen-NIL: >10 IU/mL
NIL: 0.03 IU/mL
QuantiFERON-TB Gold Plus: NEGATIVE
TB1-NIL: 0.01 IU/mL
TB2-NIL: 0.01 IU/mL

## 2019-08-23 LAB — HEPATITIS B SURFACE ANTIBODY,QUALITATIVE: Hep B S Ab: NONREACTIVE

## 2019-08-23 LAB — HEPATITIS B SURFACE ANTIGEN: Hepatitis B Surface Ag: NONREACTIVE

## 2019-08-23 LAB — HIV-1/2 AB - DIFFERENTIATION
HIV-1 antibody: POSITIVE — AB
HIV-2 Ab: NEGATIVE

## 2019-08-23 LAB — HEPATITIS A ANTIBODY, TOTAL: Hepatitis A AB,Total: REACTIVE — AB

## 2019-08-23 LAB — HIV-1 GENOTYPE: HIV-1 Genotype: DETECTED — AB

## 2019-08-23 LAB — HIV-1 RNA ULTRAQUANT REFLEX TO GENTYP+
HIV 1 RNA Quant: 78600 copies/mL — ABNORMAL HIGH
HIV-1 RNA Quant, Log: 4.9 Log copies/mL — ABNORMAL HIGH

## 2019-08-23 LAB — HEPATITIS C RNA QUANTITATIVE
HCV Quantitative Log: 6.37 Log IU/mL — ABNORMAL HIGH
HCV RNA, PCR, QN: 2360000 IU/mL — ABNORMAL HIGH

## 2019-08-30 ENCOUNTER — Encounter: Payer: Self-pay | Admitting: Infectious Diseases

## 2019-09-06 ENCOUNTER — Other Ambulatory Visit: Payer: Self-pay | Admitting: Student

## 2019-09-06 ENCOUNTER — Encounter: Payer: Self-pay | Admitting: Infectious Diseases

## 2019-09-06 ENCOUNTER — Other Ambulatory Visit: Payer: Self-pay

## 2019-09-06 ENCOUNTER — Ambulatory Visit (INDEPENDENT_AMBULATORY_CARE_PROVIDER_SITE_OTHER): Payer: Medicare Other | Admitting: Infectious Diseases

## 2019-09-06 ENCOUNTER — Telehealth: Payer: Self-pay | Admitting: Pharmacy Technician

## 2019-09-06 ENCOUNTER — Other Ambulatory Visit (HOSPITAL_COMMUNITY): Payer: Self-pay | Admitting: Student

## 2019-09-06 ENCOUNTER — Ambulatory Visit: Payer: Medicare Other | Admitting: Pharmacist

## 2019-09-06 VITALS — BP 166/87 | HR 91 | Temp 98.3°F | Wt 143.0 lb

## 2019-09-06 DIAGNOSIS — B20 Human immunodeficiency virus [HIV] disease: Secondary | ICD-10-CM

## 2019-09-06 DIAGNOSIS — B1920 Unspecified viral hepatitis C without hepatic coma: Secondary | ICD-10-CM

## 2019-09-06 DIAGNOSIS — D696 Thrombocytopenia, unspecified: Secondary | ICD-10-CM | POA: Insufficient documentation

## 2019-09-06 DIAGNOSIS — N183 Chronic kidney disease, stage 3 unspecified: Secondary | ICD-10-CM

## 2019-09-06 DIAGNOSIS — M109 Gout, unspecified: Secondary | ICD-10-CM

## 2019-09-06 DIAGNOSIS — N1832 Chronic kidney disease, stage 3b: Secondary | ICD-10-CM

## 2019-09-06 DIAGNOSIS — I10 Essential (primary) hypertension: Secondary | ICD-10-CM | POA: Insufficient documentation

## 2019-09-06 DIAGNOSIS — B182 Chronic viral hepatitis C: Secondary | ICD-10-CM

## 2019-09-06 DIAGNOSIS — N189 Chronic kidney disease, unspecified: Secondary | ICD-10-CM | POA: Insufficient documentation

## 2019-09-06 DIAGNOSIS — K7460 Unspecified cirrhosis of liver: Secondary | ICD-10-CM | POA: Diagnosis not present

## 2019-09-06 DIAGNOSIS — R768 Other specified abnormal immunological findings in serum: Secondary | ICD-10-CM

## 2019-09-06 MED ORDER — BIKTARVY 50-200-25 MG PO TABS: 1.0000 | ORAL_TABLET | Freq: Every day | ORAL | 11 refills | Status: DC

## 2019-09-06 NOTE — Progress Notes (Signed)
HPI: Noah Robinson is a 74 y.o. male who presents to the [facility] today to initiate care for a newly diagnosed HIV infection.  Patient Active Problem List   Diagnosis Date Noted   H. pylori infection 07/27/2017   Rectal bleeding 07/27/2017   HIV disease ([place]) 05/02/2017   Hepatic cirrhosis ([place]) 05/02/2017   Anemia 03/03/2017   Hepatitis C, chronic ([place]) 03/03/2017   HCV antibody positive 11/02/2016    Patient's Medications  New Prescriptions   No medications on file  Previous Medications   CLOBETASOL OINTMENT (TEMOVATE) 0.05 %       FEROSUL 325 (65 FE) MG TABLET    Take 1 tablet by mouth daily at 12 noon.   HYDROXYZINE (ATARAX/VISTARIL) 50 MG TABLET    Take 50 mg by mouth every 6 (six) hours as needed.   LACTULOSE (CHRONULAC) 10 GM/15ML SOLUTION    Take by mouth.   LISINOPRIL (ZESTRIL) 5 MG TABLET    Take 5 mg by mouth daily.   PANTOPRAZOLE (PROTONIX) 40 MG TABLET    1 PO 30 MINUTES PRIOR TO MEALS BID FOR 3 MOS THEN QD   TRAMADOL (ULTRAM) 50 MG TABLET    Take 1 tablet by mouth 2 (two) times daily.  Modified Medications   No medications on file  Discontinued Medications   No medications on file    Allergies: Allergies  Allergen Reactions   Pantoprazole Hives    Past Medical History: Past Medical History:  Diagnosis Date   Cirrhosis ([place])    Colon cancer ([place])    per patient recollection   Enlarged prostate    HCV (hepatitis C virus)    HIV (human immunodeficiency virus infection) ([place])     Social History: Social History   Socioeconomic History   Marital status: Divorced    Spouse name: Not on file   Number of children: Not on file   Years of education: Not on file   Highest education level: Not on file  Occupational History   Not on file  Tobacco Use   Smoking status: Never Smoker   Smokeless tobacco: Never Used  Vaping Use   Vaping Use: Never used  Substance and Sexual Activity   Alcohol use: No   Drug use: Not Currently      Types: Marijuana    Comment: 01/31/18-states he quit all that   Sexual activity: Not Currently    Birth control/protection: None  Other Topics Concern   Not on file  Social History Narrative   Not on file   Social Determinants of Health   Financial Resource Strain:    Difficulty of Paying Living Expenses:   Food Insecurity:    Worried About Charity fundraiser in the Last Year:    Arboriculturist in the Last Year:   Transportation Needs:    Film/video editor (Medical):    Lack of Transportation (Non-Medical):   Physical Activity:    Days of Exercise per Week:    Minutes of Exercise per Session:   Stress:    Feeling of Stress :   Social Connections:    Frequency of Communication with Friends and Family:    Frequency of Social Gatherings with Friends and Family:    Attends Religious Services:    Active Member of Clubs or Organizations:    Attends Music therapist:    Marital Status:     Labs: Lab Results  Component Value Date   HIV1RNAQUANT 78,600 (  H) 08/15/2019   CD4TABS 310 (L) 08/15/2019    RPR and STI Lab Results  Component Value Date   LABRPR NON-REACTIVE 08/15/2019    STI Results GC CT  08/15/2019 Negative Negative    Hepatitis B Lab Results  Component Value Date   HEPBSAB NON-REACTIVE 08/15/2019   HEPBSAG NON-REACTIVE 08/15/2019   HEPBCAB NON-REACTIVE 08/15/2019   Hepatitis C Lab Results  Component Value Date   HEPCAB REACTIVE (A) 08/15/2019   HCVRNAPCRQN 2,360,000 (H) 08/15/2019   Hepatitis A Lab Results  Component Value Date   HAV REACTIVE (A) 08/15/2019   Lipids: Lab Results  Component Value Date   CHOL 138 08/15/2019   TRIG 206 (H) 08/15/2019   HDL 38 (L) 08/15/2019   CHOLHDL 3.6 08/15/2019   LDLCALC 70 08/15/2019    Current HIV Regimen: Treatment naive  Assessment: Noah Robinson is here today to initiate care with Noah Robinson for his newly diagnosed HIV infection.  He is treatment naive with an  initial HIV viral load of 78,600 and a CD4 count of 310.  No resistance mutations found on initial genotype. Will start patient on [place].  Explained that [name] is a one pill once daily medication with or without food and the importance of not missing any doses. Explained resistance and how it develops and why it is so important to take Biktarvy daily and not skip days or doses. Counseled patient to take it around the same time each day. Counseled on what to do if dose is missed, if closer to missed dose take immediately, if closer to next dose then skip and resume normal schedule.   Cautioned on possible side effects the first week or so including nausea, diarrhea, dizziness, and headaches but that they should resolve after the first couple of weeks. I reviewed patient medications and found no drug interactions. Counseled patient to separate Biktarvy from divalent cations including multivitamins. Discussed with patient to call clinic if he starts a new medication or herbal supplement. I gave the patient my card and told him to call me with any issues/questions/concerns.  He also has Hepatitis C. Will prioritize starting HIV medication and then get him approved for Hepatitis C treatment. He is also approved for SPAP to cover his Medicare copays. He knows to call me with any questions or issues.  Plan: - Start Biktarvy PO once daily  Kerstie Agent L. Cyenna Rebello, PharmD, BCIDP, AAHIVP, CPP Clinical Pharmacist Practitioner Infectious Diseases [facility] 09/06/2019, 4:26 PM

## 2019-09-06 NOTE — Assessment & Plan Note (Signed)
History of GIB - explained thrombocytopenia is d/t cirrhosis.  He should probably consider an alternative to Indomethacin for gout management with this history.

## 2019-09-06 NOTE — Telephone Encounter (Signed)
[facility] Patient Teacher, English as a foreign language completed.    The patient is insured through [facility] and has a $1,059.01 copay for an HIV medication.  A prior authorization will be needed for a Hepatitis C medication.  We will continue to follow to see if copay assistance is needed.  Noah Robinson. [name] Patient [facility] for Infectious Disease Phone: [phone] Fax:  [phone]

## 2019-09-06 NOTE — Assessment & Plan Note (Signed)
Out of control today > 130/80. I discussed with him that he may likely need to resume anti-hypertensives.  Would recommend his daughter help check at home a few times a week.

## 2019-09-06 NOTE — Assessment & Plan Note (Signed)
New patient here to establish for HIV care. He is treatment naive. Revealed positive test results during Hepatitis C work up in 2018. He has had trouble accepting this and recently is open to treatment.   I discussed with Lynda Rainwater and his daughter treatment options/side effects, benefits of treatment and long-term outcomes. I discussed how HIV is transmitted and the process of untreated HIV including increased risk for opportunistic infections, cancer, dementia and renal failure. Patient was counseled on routine HIV care including medication adherence, blood monitoring, necessary vaccines and follow up visits. Counseled regarding safe sex practices including: condom use, partner disclosure, limiting partners. Patient spent time talking with our pharmacist Cassie regarding successful practices of ART and understands to reach out to our clinic in the future with questions.   Will start [place] for HIV treatment. We will help with patient assistance and provide him with some samples today to get started on therapy. Counseled on proper use and recommended separation from iron tablet and any multivitamins.   General introduction to our clinic and integrated services. Dental referral placed today for [facility]. Information to schedule appointment completed today.   I spent greater than 60 minutes with the patient today. Greater than 50% of the time spent face-to-face counseling and coordination of care re: HIV and health maintenance.

## 2019-09-06 NOTE — Assessment & Plan Note (Signed)
Once we get his HIV under good control we will start Hepatitis C mediations. Likely consider Epclusa once daily. He is on Lactulose once daily but it is unclear to me whether this was given for hepatic encephalopathy or constipation. He only takes this as needed.  Will check ammonia level next lab draw as well as hep C labs. Genotype in March 2021 revealed 1a.

## 2019-09-06 NOTE — Assessment & Plan Note (Signed)
Would advise against indomethacin for treatment.

## 2019-09-06 NOTE — Assessment & Plan Note (Signed)
Had been negative previously-- may be false positive.  Considering he will need Hep C treatment in the near future will keep him on regimen that contains tenofovir for now.

## 2019-09-06 NOTE — Assessment & Plan Note (Signed)
In care with [facility] - will plan to use Biktarvy for treatment at this time until his hepatitis C is erradicated. Then can consider transition to [place] for kidney-friendly regimen given long term use.  Likely his CKD is due to long standing hypertension and not HIV related nephropathy.

## 2019-09-06 NOTE — Assessment & Plan Note (Signed)
Most likely from chronic hepatitis C and remote ETOH history. HIV co-infection can also accelerate fibrosis progression. Unclear how long he has either but positive Hep C Ab documented in our EMR 3 years ago.  He states he has GI visit soon with Dr. Gordy Levan. Will ensure he has follow up.  Order RUQ ultrasound for [facility] screen - discussed he will need these routinely Q37m.  Advised against all alcohol use.  He should have a EGD and colonoscopy again soon.  Labs c/w cirrhosis with pancytopenia. Will check INR at next lab draw as well as albumin to calculate child pugh score - no findings of decompensation on exam or history today.

## 2019-09-06 NOTE — Progress Notes (Signed)
Name: Noah Robinson  DOB: [birth year] MRN: [id number] PCP: Barry Dienes, NP    Patient Active Problem List   Diagnosis Date Noted  . Thrombocytopenia ([place]) 09/06/2019  . Gout   . HIV (human immunodeficiency virus infection) ([place])   . Hypertension   . Chronic kidney disease   . Hepatitis B core antibody positive   . H. pylori infection 07/27/2017  . Rectal bleeding 07/27/2017  . HIV disease ([place]) 05/02/2017  . Hepatic cirrhosis ([place]) 05/02/2017  . Anemia 03/03/2017  . Hepatitis C, chronic ([place]) 03/03/2017     Brief Narrative:  Noah Robinson is a 74 y.o. male with HIV disease, AIDS (-) with CD4 nadir 323.  HIV Risk: heterosexual  History of OIs: none Intake Labs 07/2019: Hep B sAg (-), sAb (-), cAb (+); Hep A (-), Hep C (+) Quantiferon (-) HLA B*5701 (-) G6PD: ()   Previous Regimens: . none  Genotypes: . 07/2019 - wildtype  Subjective:   Chief Complaint  Patient presents with  . New Patient (Initial Visit)    B20 and Hep C     HPI: Noah Robinson is here with his daughter for new patient visit. He has given permission to discuss all aspects of his care with her present.  He was told first a few years back of his (+) HIV test and Hepatitis C tests. He has never been on treatment for either. He did not believe the results at first and had blood work drawn to confirm several times now. He does not report many sexual partners (although does not detail gender of partners) and never any IV drug use. He has used other drugs in the past but none recently per his report. He has been told by other doctors he has cirrhosis of the liver. Does have a history of GI bleeding. No peripheral or central edema. Never h/o paracentesis.   He has noticed some low energy over the last 2-3 months. He feels best in the morning. Mows lawns for work. Can't do more than 2 yards at a time now before he is done for the day. He has had a lot of weight loss about 40 lbs - has a poor appetite and bad teeth.    He has a history of "rectal cancer" or precancerous lesion that he underwent radiation therapy about a year ago after surgical excision. He has not seen a doctor for this since he was treatment. Has not had a repeat colonoscopy since. He states he thinks he was supposed to have a capsule study or colonoscopy to follow up. Has had trouble with visits locally due to insurance politics, but this has improved and he is catching up on appointments.   [place]. Other than the above he has a PMHx including HTN (currently not on medications due to intolerances), anemia, hepatic cirrhosis, gout, pre diabetes, hyperlipidemia, elevated PSA,   He follows with Dr. Shearon Stalls in [place]. Records reviewed - (+) cocaine screen in March 2021. His Hep B core Ab is positive with negative surface antigen and negative surface antibody --> previously his core antibody was negative from outside records. He has a genotype documented 1a in records with positive hepatitis C RNA > 2 million.   Review of Systems  Constitutional: Positive for activity change, appetite change, fatigue and unexpected weight change.  HENT: Positive for dental problem (painful chewing at times). Negative for mouth sores and sore throat.   Respiratory: Negative for chest tightness,  shortness of breath and wheezing.   Cardiovascular: Negative for chest pain, palpitations and leg swelling.  Gastrointestinal: Positive for constipation (intermittently). Negative for abdominal distention, abdominal pain, diarrhea and rectal pain. Blood in stool: h/o GIB.  Genitourinary: Positive for difficulty urinating (enlarged prostate ). Negative for discharge and genital sores.  Musculoskeletal: Negative for arthralgias and joint swelling.  Skin: Negative for rash.  Neurological: Negative for dizziness, light-headedness and numbness.  Hematological: Negative for adenopathy.     Past Medical History:  Diagnosis Date  . Chronic  kidney disease   . Cirrhosis ([place])   . Colon cancer ([place])    per patient recollection  . Enlarged prostate   . Gout   . HCV (hepatitis C virus)   . Hepatitis B core antibody positive   . HIV (human immunodeficiency virus infection) ([place])    Dx 2019 approx, CD4 nadir 323.   Marland Kitchen Hyperlipidemia   . Hypertension     Outpatient Medications Prior to Visit  Medication Sig Dispense Refill  . clobetasol ointment (TEMOVATE) 0.05 %     . FEROSUL 325 (65 Fe) MG tablet Take 1 tablet by mouth daily at 12 noon.  1  . hydrOXYzine (ATARAX/VISTARIL) 50 MG tablet Take 50 mg by mouth every 6 (six) hours as needed.    . lactulose (CHRONULAC) 10 GM/15ML solution Take by mouth.    Mar[place] Kitchen lisinopril (ZESTRIL) 5 MG tablet Take 5 mg by mouth daily.    . pantoprazole (PROTONIX) 40 MG tablet 1 PO 30 MINUTES PRIOR TO MEALS BID FOR 3 MOS THEN QD (Patient taking differently: 1 PO 30 MINUTES PRIOR TO MEALS BID) 60 tablet 11  . traMADol (ULTRAM) 50 MG tablet Take 1 tablet by mouth 2 (two) times daily.     No facility-administered medications prior to visit.     Allergies  Allergen Reactions  . Pantoprazole Hives    Social History   Tobacco Use  . Smoking status: Never Smoker  . Smokeless tobacco: Never Used  Vaping Use  . Vaping Use: Never used  Substance Use Topics  . Alcohol use: No    Comment: History of heavy use, remote   . Drug use: Not Currently    Types: Marijuana    Comment: 01/31/18-states he quit all that    Family History  Problem Relation Age of Onset  . Diabetes Mother   . Healthy Daughter   . Colon cancer Neg Hx     Social History   Substance and Sexual Activity  Sexual Activity Not Currently  . Birth control/protection: None     Objective:   Vitals:   09/06/19 1529  BP: (!) 166/87  Pulse: 91  Temp: 98.3 F (36.8 C)  TempSrc: Oral  Weight: 143 lb (64.9 kg)   Body mass index is 21.12 kg/m.  Physical Exam Constitutional:      Appearance: Normal appearance. He is  well-developed. He is not ill-appearing.     Comments: Thin appearing, well developed. No distress. Pleasant.   HENT:     Mouth/Throat:     Dentition: Normal dentition. No dental abscesses.  Cardiovascular:     Rate and Rhythm: Normal rate and regular rhythm.     Heart sounds: Normal heart sounds.  Pulmonary:     Effort: Pulmonary effort is normal.     Breath sounds: Normal breath sounds.  Abdominal:     General: There is no distension.     Palpations: Abdomen is soft.     Tenderness: There  is no abdominal tenderness.  Musculoskeletal:        General: Normal range of motion.  Lymphadenopathy:     Cervical: No cervical adenopathy.  Skin:    General: Skin is warm and dry.     Capillary Refill: Capillary refill takes less than 2 seconds.     Findings: No rash.  Neurological:     Mental Status: He is alert and oriented to person, place, and time.  Psychiatric:        Behavior: Behavior normal.        Judgment: Judgment normal.     Comments: In good spirits today and engaged in care discussion.      Lab Results Lab Results  Component Value Date   WBC 4.7 08/15/2019   HGB 9.7 (L) 08/15/2019   HCT 29.6 (L) 08/15/2019   MCV 91.1 08/15/2019   PLT 129 (L) 08/15/2019    Lab Results  Component Value Date   CREATININE 1.28 (H) 08/15/2019   BUN 24 08/15/2019   NA 132 (L) 08/15/2019   K 4.5 08/15/2019   CL 101 08/15/2019   CO2 23 08/15/2019    Lab Results  Component Value Date   ALT 58 (H) 08/15/2019   AST 84 (H) 08/15/2019   ALKPHOS 114 11/02/2016   BILITOT 0.5 08/15/2019    Lab Results  Component Value Date   CHOL 138 08/15/2019   HDL 38 (L) 08/15/2019   LDLCALC 70 08/15/2019   TRIG 206 (H) 08/15/2019   CHOLHDL 3.6 08/15/2019   HIV 1 RNA Quant (copies/mL)  Date Value  08/15/2019 78,600 (H)   CD4 T Cell Abs (/uL)  Date Value  08/15/2019 310 (L)     Assessment & Plan:   Problem List Items Addressed This Visit      Unprioritized   HIV disease ([place]) -  Primary (Chronic)    New patient here to establish for HIV care. He is treatment naive. Revealed positive test results during Hepatitis C work up in 2018. He has had trouble accepting this and recently is open to treatment.   I discussed with Lynda Rainwater and his daughter treatment options/side effects, benefits of treatment and long-term outcomes. I discussed how HIV is transmitted and the process of untreated HIV including increased risk for opportunistic infections, cancer, dementia and renal failure. Patient was counseled on routine HIV care including medication adherence, blood monitoring, necessary vaccines and follow up visits. Counseled regarding safe sex practices including: condom use, partner disclosure, limiting partners. Patient spent time talking with our pharmacist Cassie regarding successful practices of ART and understands to reach out to our clinic in the future with questions.   Will start [place] for HIV treatment. We will help with patient assistance and provide him with some samples today to get started on therapy. Counseled on proper use and recommended separation from iron tablet and any multivitamins.   General introduction to our clinic and integrated services. Dental referral placed today for [facility]. Information to schedule appointment completed today.   I spent greater than 60 minutes with the patient today. Greater than 50% of the time spent face-to-face counseling and coordination of care re: HIV and health maintenance.        Relevant Medications   bictegravir-emtricitabine-tenofovir AF (BIKTARVY) 50-200-25 MG TABS tablet   RESOLVED: Cirrhosis (HCC) (Chronic)   Relevant Medications   lactulose (CHRONULAC) 10 GM/15ML solution   Other Relevant Orders   US ABDOMEN RUQ W/ELASTOGRAPHY   Hepatitis C,  chronic ([place])    Once we get his HIV under good control we will start Hepatitis C mediations. Likely consider Epclusa once daily. He is on Lactulose once daily  but it is unclear to me whether this was given for hepatic encephalopathy or constipation. He only takes this as needed.  Will check ammonia level next lab draw as well as hep C labs. Genotype in March 2021 revealed 1a.       Relevant Medications   bictegravir-emtricitabine-tenofovir AF (BIKTARVY) 50-200-25 MG TABS tablet   Other Relevant Orders   US ABDOMEN RUQ W/ELASTOGRAPHY   Gout    Would advise against indomethacin for treatment.       RESOLVED: HCV (hepatitis C virus)   Relevant Medications   bictegravir-emtricitabine-tenofovir AF (BIKTARVY) 50-200-25 MG TABS tablet   Other Relevant Orders   US ABDOMEN RUQ W/ELASTOGRAPHY   Hypertension    Out of control today > 130/80. I discussed with him that he may likely need to resume anti-hypertensives.  Would recommend his daughter help check at home a few times a week.       Relevant Medications   lisinopril (ZESTRIL) 5 MG tablet   Chronic kidney disease    In care with [facility] - will plan to use Biktarvy for treatment at this time until his hepatitis C is erradicated. Then can consider transition to [place] for kidney-friendly regimen given long term use.  Likely his CKD is due to long standing hypertension and not HIV related nephropathy.       Hepatitis B core antibody positive    Had been negative previously-- may be false positive.  Considering he will need Hep C treatment in the near future will keep him on regimen that contains tenofovir for now.       Thrombocytopenia ([place])    History of GIB - explained thrombocytopenia is d/t cirrhosis.  He should probably consider an alternative to Indomethacin for gout management with this history.         He will return in 6 weeks to repeat blood work and adherence / tolerability to medication.    Janene Madeira, MSN, NP-C [facility] for Infectious [place] Pager: [phone] Office: [phone]  09/06/19  9:28 PM

## 2019-09-06 NOTE — Patient Instructions (Addendum)
Biktarvy is the pill I would like for you to start taking to treat you - this will need to be taken once a day around the same time.  - Common side effects for a short time frame usually include headaches, nausea and diarrhea - OK to take over the counter tylenol for headaches and imodium for diarrhea - Try taking with food if you are nauseated  - If you take any multivitamins or supplements please separate them from your Biktarvy by 6 hours before and after.  The main thing is do not have them in the stomach at the same time.  Please come back in 6 weeks to check in   Will get your treatment for hepatitis c started in time   Please schedule your ultrasound on your way out

## 2019-09-07 ENCOUNTER — Encounter: Payer: Self-pay | Admitting: Infectious Diseases

## 2019-09-14 ENCOUNTER — Other Ambulatory Visit: Payer: Self-pay

## 2019-09-14 ENCOUNTER — Ambulatory Visit
Admission: RE | Admit: 2019-09-14 | Discharge: 2019-09-14 | Disposition: A | Discharge status: Home / Self Care | Payer: Medicare Other | Source: Ambulatory Visit | Attending: Student | Admitting: Student

## 2019-09-14 DIAGNOSIS — N1832 Chronic kidney disease, stage 3b: Secondary | ICD-10-CM | POA: Insufficient documentation

## 2019-09-17 ENCOUNTER — Telehealth: Payer: Self-pay

## 2019-09-17 NOTE — Telephone Encounter (Signed)
Given the location I suspect something else is going on but it is hard to know without seeing it. 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   Can she help describe the rash at all? Is the rash on his torso or back at all?  When did it first start? Progression? Ever had anything like this before?  Or maybe help them sign up for MyChart to get the m an ability to upload some photos.   Cassie what are your thoughts?

## 2019-09-17 NOTE — Telephone Encounter (Signed)
Patient's friend (tracey) called to report that patient has developed rash on stomach, and between legs with severe itching after starting Biktarvy. No reported difficulty breathing or shortness of breath, however friend is concerned about rash. Forwarding to provider and pharmacist.  Carlean Purl, RN

## 2019-09-17 NOTE — Telephone Encounter (Signed)
Sent patient # MyChart activation information and gave instructions to friend on how to register. Instructed her to take pictures of rash and send them to provider to review. Attempted to ask follow up questions however patient was in the bathroom and unable to provide information.She does report that the rash started after starting Biktarvy. Friend stated that they were looking into going to urgent care for assessment.   Rashawn Rolon Lorita Officer, RN

## 2019-09-17 NOTE — Telephone Encounter (Signed)
If they are going to consider urgent care visit I would just see if they can call us after he is evaluated with an update.   I have a strong feeling it is a yeast rash and would be willing to call in some fluconazole to try for him.

## 2019-09-18 NOTE — Telephone Encounter (Signed)
Would tend to agree that I suspect it is a yeast rash. Location seems odd if it was related to [facility]!

## 2019-10-03 ENCOUNTER — Ambulatory Visit (HOSPITAL_COMMUNITY): Payer: Medicare Other

## 2019-10-08 ENCOUNTER — Other Ambulatory Visit: Payer: Self-pay

## 2019-10-08 ENCOUNTER — Ambulatory Visit (HOSPITAL_COMMUNITY)
Admission: RE | Admit: 2019-10-08 | Discharge: 2019-10-08 | Disposition: A | Discharge status: Home / Self Care | Payer: Medicare Other | Source: Ambulatory Visit | Attending: Infectious Diseases | Admitting: Infectious Diseases

## 2019-10-08 DIAGNOSIS — K7460 Unspecified cirrhosis of liver: Secondary | ICD-10-CM | POA: Insufficient documentation

## 2019-10-08 DIAGNOSIS — B182 Chronic viral hepatitis C: Secondary | ICD-10-CM | POA: Diagnosis present

## 2019-10-23 ENCOUNTER — Ambulatory Visit: Payer: Medicare Other | Admitting: Infectious Diseases

## 2019-10-23 ENCOUNTER — Encounter: Payer: Self-pay | Admitting: Infectious Diseases

## 2019-10-23 ENCOUNTER — Telehealth: Payer: Self-pay | Admitting: *Deleted

## 2019-10-23 ENCOUNTER — Other Ambulatory Visit: Payer: Self-pay

## 2019-10-23 VITALS — BP 175/84 | HR 88 | Temp 97.8°F | Wt 142.0 lb

## 2019-10-23 DIAGNOSIS — K7460 Unspecified cirrhosis of liver: Secondary | ICD-10-CM | POA: Diagnosis not present

## 2019-10-23 DIAGNOSIS — K625 Hemorrhage of anus and rectum: Secondary | ICD-10-CM

## 2019-10-23 DIAGNOSIS — B182 Chronic viral hepatitis C: Secondary | ICD-10-CM | POA: Diagnosis not present

## 2019-10-23 DIAGNOSIS — B20 Human immunodeficiency virus [HIV] disease: Secondary | ICD-10-CM | POA: Diagnosis not present

## 2019-10-23 DIAGNOSIS — B1920 Unspecified viral hepatitis C without hepatic coma: Secondary | ICD-10-CM

## 2019-10-23 DIAGNOSIS — Z21 Asymptomatic human immunodeficiency virus [HIV] infection status: Secondary | ICD-10-CM

## 2019-10-23 MED ORDER — BIKTARVY 50-200-25 MG PO TABS: 1.0000 | ORAL_TABLET | Freq: Every day | ORAL | 3 refills | Status: AC

## 2019-10-23 NOTE — Patient Instructions (Addendum)
Please continue taking your Biktarvy every day.   Please stop by the lab on your way out to have a blood draw - this will help me understand more about how long you need treatment to cure the Hepatitis C infection.   I will call you and your daughter with results and get started on your hepatitis C medicaton   I would like to get you back in to see the liver/GI doctor   Please come back in 3 months

## 2019-10-23 NOTE — Progress Notes (Addendum)
Name: Noah Robinson  DOB: 1945-03-22 MRN: [id number] PCP: Barry Dienes, NP    Patient Active Problem List   Diagnosis Date Noted  . Thrombocytopenia ([place]) 09/06/2019  . Gout   . HIV (human immunodeficiency virus infection) ([place])   . Hypertension   . Chronic kidney disease   . Hepatitis B core antibody positive   . H. pylori infection 07/27/2017  . Rectal bleeding 07/27/2017  . HIV disease ([place]) 05/02/2017  . Hepatic cirrhosis ([place]) 05/02/2017  . Anemia 03/03/2017  . Hepatitis C, chronic ([place]) 03/03/2017     Brief Narrative:  Noah Robinson is a 74 y.o. male with HIV disease, AIDS (-) with CD4 nadir 323.  HIV Risk: heterosexual  History of OIs: none Intake Labs 07/2019: Hep B sAg (-), sAb (-), cAb (+); Hep A (-), Hep C (+) Quantiferon (-) HLA B*5701 (-) G6PD: ()   Previous Regimens: . none  Genotypes: . 07/2019 - wildtype  Subjective:   Chief Complaint  Patient presents with  . Follow-up    B20     HPI: Has had bleeding from bowels for a few weeks.    Noah Robinson is here with his daughter for follow up HIV treatment. He has given permission to discuss all aspects of his care with her present.   Noah Robinson has noticed he has had an increase in his energy over the last month since starting [place]. No concerns for side effects. He does not report any missed doses either.   Main concern today is getting started on hepatitis C treatment per his daughter --> He has a genotype documented 1a in records with positive hepatitis C RNA > 2 million.   He also is having some bleeding from the rectum that is described to be bright red and dribbles every so often. He associates it most frequently with episodes of constipation. Does not like the lactulose because of the irritation it causes and diarrhea. Unclear if he takes this for liver or consitipation.    Review of Systems  Constitutional: Positive for activity change. Negative for appetite change, fatigue and unexpected weight  change.  HENT: Negative for sore throat.   Respiratory: Negative for chest tightness, shortness of breath and wheezing.   Cardiovascular: Negative for chest pain, palpitations and leg swelling.  Gastrointestinal: Positive for blood in stool and constipation (intermittently). Negative for abdominal distention, abdominal pain, diarrhea and rectal pain.  Genitourinary: Positive for difficulty urinating (enlarged prostate ). Negative for discharge and genital sores.  Musculoskeletal: Negative for arthralgias and joint swelling.  Skin: Negative for rash.  Neurological: Negative for dizziness, light-headedness and numbness.  Hematological: Negative for adenopathy.     Past Medical History:  Diagnosis Date  . Chronic kidney disease   . Cirrhosis ([place])   . Colon cancer ([place])    per patient recollection  . Enlarged prostate   . Gout   . HCV (hepatitis C virus)   . Hepatitis B core antibody positive   . HIV (human immunodeficiency virus infection) ([place])    Dx 2019 approx, CD4 nadir 323.   Marland Kitchen Hyperlipidemia   . Hypertension     Outpatient Medications Prior to Visit  Medication Sig Dispense Refill  . clobetasol ointment (TEMOVATE) 0.05 %     . FEROSUL 325 (65 Fe) MG tablet Take 1 tablet by mouth daily at 12 noon.  1  . hydrOXYzine (ATARAX/VISTARIL) 50 MG tablet Take 50 mg by mouth every 6 (six) hours as needed.    Marland Kitchen  lactulose (CHRONULAC) 10 GM/15ML solution Take by mouth.    Marland Kitchen lisinopril (ZESTRIL) 5 MG tablet Take 5 mg by mouth daily.    . pantoprazole (PROTONIX) 40 MG tablet 1 PO 30 MINUTES PRIOR TO MEALS BID FOR 3 MOS THEN QD (Patient taking differently: 1 PO 30 MINUTES PRIOR TO MEALS BID) 60 tablet 11  . predniSONE (DELTASONE) 20 MG tablet Take 20 mg by mouth 2 (two) times daily.    . traMADol (ULTRAM) 50 MG tablet Take 1 tablet by mouth 2 (two) times daily.    . bictegravir-emtricitabine-tenofovir AF (BIKTARVY) 50-200-25 MG TABS tablet Take 1 tablet by mouth daily. 30 tablet 11   No  facility-administered medications prior to visit.     Allergies  Allergen Reactions  . Pantoprazole Hives    Social History   Tobacco Use  . Smoking status: Never Smoker  . Smokeless tobacco: Never Used  Vaping Use  . Vaping Use: Never used  Substance Use Topics  . Alcohol use: No    Comment: History of heavy use, remote   . Drug use: Not Currently    Types: Marijuana    Comment: 01/31/18-states he quit all that    Family History  Problem Relation Age of Onset  . Diabetes Mother   . Healthy Daughter   . Colon cancer Neg Hx     Social History   Substance and Sexual Activity  Sexual Activity Not Currently  . Birth control/protection: None     Objective:   Vitals:   10/23/19 1552  BP: (!) 175/84  Pulse: 88  Temp: 97.8 F (36.6 C)  TempSrc: Oral  Weight: 142 lb (64.4 kg)   Body mass index is 20.97 kg/m.  Physical Exam Constitutional:      Appearance: Normal appearance. He is well-developed. He is not ill-appearing.     Comments: Thin appearing, well developed. No distress. Pleasant.   HENT:     Mouth/Throat:     Dentition: Normal dentition. No dental abscesses.  Cardiovascular:     Rate and Rhythm: Normal rate and regular rhythm.     Heart sounds: Normal heart sounds.  Pulmonary:     Effort: Pulmonary effort is normal.     Breath sounds: Normal breath sounds.  Abdominal:     General: There is no distension.     Palpations: Abdomen is soft.     Tenderness: There is no abdominal tenderness.     Comments: No findings concerning for cirrhosis on exam   Musculoskeletal:        General: Normal range of motion.  Lymphadenopathy:     Cervical: No cervical adenopathy.  Skin:    General: Skin is warm and dry.     Capillary Refill: Capillary refill takes less than 2 seconds.     Findings: No rash.  Neurological:     Mental Status: He is alert and oriented to person, place, and time.  Psychiatric:        Behavior: Behavior normal.        Judgment:  Judgment normal.     Comments: In good spirits today and engaged in care discussion.      Lab Results Lab Results  Component Value Date   WBC 4.7 08/15/2019   HGB 9.7 (L) 08/15/2019   HCT 29.6 (L) 08/15/2019   MCV 91.1 08/15/2019   PLT 129 (L) 08/15/2019    Lab Results  Component Value Date   CREATININE 1.28 (H) 08/15/2019   BUN 24 08/15/2019  NA 132 (L) 08/15/2019   K 4.5 08/15/2019   CL 101 08/15/2019   CO2 23 08/15/2019    Lab Results  Component Value Date   ALT 58 (H) 08/15/2019   AST 84 (H) 08/15/2019   ALKPHOS 114 11/02/2016   BILITOT 0.5 08/15/2019    Lab Results  Component Value Date   CHOL 138 08/15/2019   HDL 38 (L) 08/15/2019   LDLCALC 70 08/15/2019   TRIG 206 (H) 08/15/2019   CHOLHDL 3.6 08/15/2019   HIV 1 RNA Quant (copies/mL)  Date Value  08/15/2019 78,600 (H)   CD4 T Cell Abs (/uL)  Date Value  08/15/2019 310 (L)     Assessment & Plan:   Problem List Items Addressed This Visit      Unprioritized   Rectal bleeding    Discussed referral back to GI for assistance with rectal bleeding, constipation and cirrhosis.       HIV disease ([place]) (Chronic)   Relevant Medications   bictegravir-emtricitabine-tenofovir AF (BIKTARVY) 50-200-25 MG TABS tablet   HIV (human immunodeficiency virus infection) ([place]) (Chronic)    Seems to be tolerating Biktarvy well early on in treatment. He is taking this correctly and no concern for side effects.  Will draw repeat VL today to evaluate therapeutic response.        Relevant Medications   bictegravir-emtricitabine-tenofovir AF (BIKTARVY) 50-200-25 MG TABS tablet   Hepatitis C, chronic (HCC)    Extremely elevated kilopascal measuement on FibroScan - will check AFP and proceed with MRI if elevated to better evaluate for [facility].  INR and Albumin to calculate child pugh score - I suspect he may need 69mEpclusa for decompensated cirrhosis. Unclear why he was given lactulose - ?constipation.  He is not  encephalopathic on exam today.       Relevant Medications   bictegravir-emtricitabine-tenofovir AF (BIKTARVY) 50-200-25 MG TABS tablet   Hepatic cirrhosis (HCC) (Chronic)    Liver mri as outlined above. Referral to GI       Relevant Orders   Liver Fibrosis, FibroTest-ActiTest   Protime-INR (Completed)   AFP tumor marker (Completed)   Ambulatory referral to Gastroenterology   MR LIVER W WO CONTRAST    Other Visit Diagnoses    Hepatitis C virus infection without hepatic coma, unspecified chronicity    -  Primary   Relevant Medications   bictegravir-emtricitabine-tenofovir AF (BIKTARVY) 50-200-25 MG TABS tablet   Other Relevant Orders   Ambulatory referral to Gastroenterology   MR LIVER W WDupree MSN, NP-C RFort Drumfor Infectious DRedlandPager: [phone]: [phone] 10/24/19  8:43 PM   Treatment plan was discussed with the  Advance Practice Provider.  I have personally reviewed the clinical findings, labs, imaging studies and management of this patient in detail.  I agree with the documentation, as recorded by the Advance Practice Provider.  RThayer Headings MD

## 2019-10-23 NOTE — Telephone Encounter (Signed)
Patient and his girlfriend called with information to pass to his provider before today's appointment. He will be coming today at 3:45 with his daughter instead of his girlfriend.  Noah Robinson "stays cold" and has been passing bright red blood with bowel movements for the past few weeks. The amount and frequency of these episodes has increased.  He will now sometimes splatter blood on the toilet seat or floor. His girlfriend reports the amount was large enough to have to be mopped up once. He does not bleed at any time other than when he has bowel movements.  He does not see blood in the shower or in his undergarments. Patient's girlfriend would like any recommendations written down in AVS so that she can help him follow up. Landis Gandy, RN

## 2019-10-24 ENCOUNTER — Telehealth: Payer: Self-pay | Admitting: Infectious Diseases

## 2019-10-24 NOTE — Assessment & Plan Note (Signed)
Seems to be tolerating Biktarvy well early on in treatment. He is taking this correctly and no concern for side effects.  Will draw repeat VL today to evaluate therapeutic response.

## 2019-10-24 NOTE — Assessment & Plan Note (Deleted)
Seems to be tolerating Biktarvy well early on in treatment. He is taking this correctly and no concern for side effects.  Will draw repeat VL today to evaluate therapeutic response.

## 2019-10-24 NOTE — Assessment & Plan Note (Signed)
Extremely elevated kilopascal measuement on FibroScan - will check AFP and proceed with MRI if elevated to better evaluate for [facility].  INR and Albumin to calculate child pugh score - I suspect he may need 40m Epclusa for decompensated cirrhosis. Unclear why he was given lactulose - ?constipation.  He is not encephalopathic on exam today.

## 2019-10-24 NOTE — Assessment & Plan Note (Signed)
Discussed referral back to GI for assistance with rectal bleeding, constipation and cirrhosis.

## 2019-10-24 NOTE — Assessment & Plan Note (Signed)
Liver mri as outlined above. Referral to GI

## 2019-10-24 NOTE — Progress Notes (Signed)
Dorie - can you try to give Noah Robinson a call tomorrow to let him know that some of his blood work regarding his liver cancer risk has returned elevated; it is possible this is just due to the fact he has cirrhosis, but we should proceed with the MRI scan of the liver we discussed at our visit this week to ensure he has no small tumors missed by the ultrasound. This is a very important step to understand the condition of his liver completely before we proceed with treatment.   I have put the order in - they live in [place] so will try to set up the MRI at [facility] if that is easier for them.   If they have further questions I am happy to call him and/or his daughter.

## 2019-10-25 ENCOUNTER — Telehealth: Payer: Self-pay

## 2019-10-25 NOTE — Telephone Encounter (Signed)
-----   Message from [place] Callas, NP sent at 10/24/2019  8:11 PM EDT ----- Noah Robinson - can you try to give Noah Robinson a call tomorrow to let him know that some of his blood work regarding his liver cancer risk has returned elevated; it is possible this is just due to the fact he has cirrhosis, but we should proceed with the MRI scan of the liver we discussed at our visit this week to ensure he has no small tumors missed by the ultrasound. This is a very important step to understand the condition of his liver completely before we proceed with treatment.   I have put the order in - they live in [place] so will try to set up the MRI at [facility] if that is easier for them.   If they have further questions I am happy to call him and/or his daughter.

## 2019-10-25 NOTE — Telephone Encounter (Signed)
Attempted to call to relay message from [place], NP about lab results. Unable to leave VM as mailbox has not been set up.  Provider received permission to contact patient's daughter. Spoke with Linus Orn, patient's daughter who requested MRI be done at [facility] if possible. No further questions or concerns at this time.    Addy Mcmannis Lorita Officer, RN

## 2019-10-25 NOTE — Telephone Encounter (Signed)
I will correct the order to have it done at [facility], Joycelyn Schmid.

## 2019-10-30 LAB — AFP TUMOR MARKER: AFP-Tumor Marker: 17.7 ng/mL — ABNORMAL HIGH (ref <6.1)

## 2019-10-30 LAB — LIVER FIBROSIS, FIBROTEST-ACTITEST
ALT: 77 U/L — ABNORMAL HIGH (ref 9–46)
Alpha-2-Macroglobulin: 294 mg/dL — ABNORMAL HIGH (ref 106–279)
Apolipoprotein A1: 138 mg/dL (ref 94–176)
Bilirubin: 0.3 mg/dL (ref 0.2–1.2)
Fibrosis Score: 0.47
GGT: 43 U/L (ref 3–70)
Haptoglobin: 158 mg/dL (ref 43–212)
Necroinflammat ACT Score: 0.52
Reference ID: 3527625

## 2019-10-30 LAB — PROTIME-INR
INR: 1.1
Prothrombin Time: 11.8 s — ABNORMAL HIGH (ref 9.0–11.5)

## 2019-11-13 ENCOUNTER — Other Ambulatory Visit: Payer: Self-pay

## 2019-11-13 ENCOUNTER — Ambulatory Visit
Admission: RE | Admit: 2019-11-13 | Discharge: 2019-11-13 | Disposition: A | Discharge status: Home / Self Care | Payer: Medicare Other | Source: Ambulatory Visit | Attending: Infectious Diseases | Admitting: Infectious Diseases

## 2019-11-13 DIAGNOSIS — B1920 Unspecified viral hepatitis C without hepatic coma: Secondary | ICD-10-CM | POA: Diagnosis not present

## 2019-11-13 DIAGNOSIS — K7460 Unspecified cirrhosis of liver: Secondary | ICD-10-CM | POA: Diagnosis present

## 2019-11-13 MED ORDER — GADOBUTROL 1 MMOL/ML IV SOLN
6.0000 mL | Freq: Once | INTRAVENOUS | Status: AC | PRN
  Administered 2019-11-13: 6 mL via INTRAVENOUS

## 2019-11-16 ENCOUNTER — Telehealth: Payer: Self-pay

## 2019-11-16 NOTE — Telephone Encounter (Signed)
-----   Message from [place] Callas, NP sent at 11/15/2019  4:15 PM EDT ----- Please call Mr. Gargus or his daughter (on file and approval to discuss health issues) to let him know the following:  His liver MRI verifies he has cirrhosis (permanent scarring of the liver). There are no concerns for any tumors or masses. We are safe to start his hepatitis C medications and our office will be in touch with him soon to schedule and discuss further about instructions.

## 2019-11-16 NOTE — Telephone Encounter (Signed)
Patient's daughter made aware of results and appointment need in 4 weeks after starting Hep C medication. Family was very appreciative of call.  Noah Robinson

## 2019-11-19 ENCOUNTER — Other Ambulatory Visit: Payer: Self-pay | Admitting: Pharmacist

## 2019-11-19 DIAGNOSIS — B1920 Unspecified viral hepatitis C without hepatic coma: Secondary | ICD-10-CM

## 2019-11-19 DIAGNOSIS — B20 Human immunodeficiency virus [HIV] disease: Secondary | ICD-10-CM

## 2019-11-19 MED ORDER — MAVYRET 100-40 MG PO TABS: 3.0000 | ORAL_TABLET | Freq: Every day | ORAL | 1 refills | Status: DC

## 2019-11-19 NOTE — Progress Notes (Signed)
I think I would try Epclusa first. Will send in and see if his insurance will approve. Thanks!

## 2019-11-19 NOTE — Progress Notes (Signed)
Actually, I take that back. Let's try Mavyret since he is on Protonix.

## 2019-11-19 NOTE — Telephone Encounter (Signed)
Thank you!   Rachel Moulds - I sent in [place] for him to [facility]. Thanks!

## 2019-11-27 ENCOUNTER — Telehealth: Payer: Self-pay

## 2019-11-27 ENCOUNTER — Other Ambulatory Visit: Payer: Self-pay | Admitting: Pharmacist

## 2019-11-27 DIAGNOSIS — B1920 Unspecified viral hepatitis C without hepatic coma: Secondary | ICD-10-CM

## 2019-11-27 MED ORDER — SOFOSBUVIR-VELPATASVIR 400-100 MG PO TABS: 1.0000 | ORAL_TABLET | Freq: Every day | ORAL | 2 refills | Status: DC

## 2019-11-27 NOTE — Progress Notes (Signed)
Insurance denied Emergency planning/management officer. Will send in Rhame. Patient will need to be counseled on how to take Epclusa with pantoprazole.

## 2019-11-27 NOTE — Telephone Encounter (Signed)
[facility] Patient Advocate Encounter   Received notification from Insurance  that prior authorization for Noah Robinson  is required.   PA submitted on 11/27/19 Key [id number] Status is pending    [facility] will continue to follow.   Ileene Patrick, [facility] Patient [facility] for Infectious Disease Phone: [phone] Fax:  [phone]

## 2019-11-27 NOTE — Telephone Encounter (Signed)
[facility] Patient Advocate Encounter  Received notification from Covermymeds that the request for prior authorization for Mavyret has been denied .       Noah Robinson, [facility] Patient [facility] for Infectious Disease Phone: [phone] Fax:  [phone]

## 2019-11-27 NOTE — Telephone Encounter (Signed)
[facility] Patient Advocate Encounter   Received notification from Medicare Part D ** that prior authorization for Mavyret  is required.   PA submitted on 11/27/19 Key [id number] Status is pending    [facility] will continue to follow.   Ileene Patrick, [facility] Patient [facility] for Infectious Disease Phone: [phone] Fax:  [phone]

## 2019-11-28 ENCOUNTER — Telehealth: Payer: Self-pay

## 2019-11-28 NOTE — Telephone Encounter (Signed)
[facility] Patient Advocate Encounter  Prior Authorization for Raeanne Gathers  has been approved.     Effective dates: 11/27/19  through 02/19/20     [facility] will continue to follow.  Ileene Patrick, [facility] Patient [facility] for Infectious Disease Phone: [phone] Fax:  [phone]

## 2019-12-04 ENCOUNTER — Telehealth: Payer: Self-pay | Admitting: Pharmacist

## 2019-12-04 ENCOUNTER — Telehealth: Payer: Self-pay

## 2019-12-04 MED FILL — EPCLUSA 400 MG-100 MG TAB: 400-100 | 28 days supply | Qty: 28 | Fill #0

## 2019-12-04 NOTE — Telephone Encounter (Signed)
Patient is approved to receive Epclusa x 12 weeks for chronic Hepatitis C infection. Spoke with patient's daughter. Daughter unsure if patient is still taking pantoprazole 40mg  daily, but counseled to take Epclusa with food 4 hours prior to taking pantoprazole if so. Otherwise, counseled to take Epclusa daily with or without food. Encouraged patient not to miss any doses and explained how their chance of cure could go down with each dose missed. Counseled patient on what to do if dose is missed - if it is closer to the missed dose take immediately; if closer to next dose skip dose and take the next dose at the usual time. Counseled patient on common side effects such as headache, fatigue, and nausea and that these normally decrease with time.  Also advised to call if patient experiences any side effects. Patient will follow-up with me in the pharmacy clinic in 4 weeks.

## 2019-12-04 NOTE — Telephone Encounter (Signed)
[facility] Patient Advocate Encounter   I was successful in securing patient an $6000.00 grant from Patient [employer] [facility]) to provide copayment coverage for Epclusa.  This will make the out of pocket cost $0.00.     I have spoken with the patient.    The billing information is as follows and has been shared with [facility].   Member ID: [id number] Group ID: [id number] RxBin: [id number] Dates of Eligibility: 09/05/19 through 12/02/2020  Patient knows to call the office with questions or concerns.  Ileene Patrick, [facility] Patient [facility] for Infectious Disease Phone: [phone] Fax:  [phone]

## 2019-12-12 DIAGNOSIS — N189 Chronic kidney disease, unspecified: Secondary | ICD-10-CM | POA: Insufficient documentation

## 2019-12-12 DIAGNOSIS — D631 Anemia in chronic kidney disease: Secondary | ICD-10-CM | POA: Insufficient documentation

## 2019-12-17 NOTE — Telephone Encounter (Signed)
Opened in error

## 2019-12-27 MED FILL — EPCLUSA 400 MG-100 MG TAB: 400-100 | 28 days supply | Qty: 28 | Fill #1

## 2020-01-03 ENCOUNTER — Ambulatory Visit: Payer: Medicare Other | Admitting: Pharmacist

## 2020-01-21 ENCOUNTER — Ambulatory Visit (INDEPENDENT_AMBULATORY_CARE_PROVIDER_SITE_OTHER): Payer: Medicare Other | Admitting: Pharmacist

## 2020-01-21 ENCOUNTER — Other Ambulatory Visit: Payer: Self-pay

## 2020-01-21 DIAGNOSIS — B1920 Unspecified viral hepatitis C without hepatic coma: Secondary | ICD-10-CM | POA: Diagnosis not present

## 2020-01-21 NOTE — Progress Notes (Signed)
HPI: Noah Robinson is a 74 y.o. male who presents to the [facility] clinic for Hepatitis C follow-up.  Medication: Epclusa x 12 weeks  Hepatitis C Genotype: 1a  Fibrosis Score: F1/F2  Hepatitis C RNA: 2.36 million at initial visit  Patient Active Problem List   Diagnosis Date Noted  . Thrombocytopenia ([place]) 09/06/2019  . Gout   . HIV (human immunodeficiency virus infection) ([place])   . Hypertension   . Chronic kidney disease   . Hepatitis B core antibody positive   . H. pylori infection 07/27/2017  . Rectal bleeding 07/27/2017  . HIV disease ([place]) 05/02/2017  . Hepatic cirrhosis ([place]) 05/02/2017  . Anemia 03/03/2017  . Hepatitis C, chronic ([place]) 03/03/2017    Patient's Medications  New Prescriptions   No medications on file  Previous Medications   BICTEGRAVIR-EMTRICITABINE-TENOFOVIR AF (BIKTARVY) 50-200-25 MG TABS TABLET    Take 1 tablet by mouth daily.   CLOBETASOL OINTMENT (TEMOVATE) 0.05 %       FEROSUL 325 (65 FE) MG TABLET    Take 1 tablet by mouth daily at 12 noon.   HYDROXYZINE (ATARAX/VISTARIL) 50 MG TABLET    Take 50 mg by mouth every 6 (six) hours as needed.   LACTULOSE (CHRONULAC) 10 GM/15ML SOLUTION    Take by mouth.   LISINOPRIL (ZESTRIL) 5 MG TABLET    Take 5 mg by mouth daily.   PANTOPRAZOLE (PROTONIX) 40 MG TABLET    1 PO 30 MINUTES PRIOR TO MEALS BID FOR 3 MOS THEN QD   PREDNISONE (DELTASONE) 20 MG TABLET    Take 20 mg by mouth 2 (two) times daily.   SOFOSBUVIR-VELPATASVIR (EPCLUSA) 400-100 MG TABS    Take 1 tablet by mouth daily.   TRAMADOL (ULTRAM) 50 MG TABLET    Take 1 tablet by mouth 2 (two) times daily.  Modified Medications   No medications on file  Discontinued Medications   No medications on file    Allergies: Allergies  Allergen Reactions  . Pantoprazole Hives    Past Medical History: Past Medical History:  Diagnosis Date  . Chronic kidney disease   . Cirrhosis ([place])   . Colon cancer ([place])    per patient recollection  .  Enlarged prostate   . Gout   . HCV (hepatitis C virus)   . Hepatitis B core antibody positive   . HIV (human immunodeficiency virus infection) ([place])    Dx 2019 approx, CD4 nadir 323.   Marland Kitchen Hyperlipidemia   . Hypertension     Social History: Social History   Socioeconomic History  . Marital status: Divorced    Spouse name: Not on file  . Number of children: Not on file  . Years of education: Not on file  . Highest education level: Not on file  Occupational History  . Not on file  Tobacco Use  . Smoking status: Never Smoker  . Smokeless tobacco: Never Used  Vaping Use  . Vaping Use: Never used  Substance and Sexual Activity  . Alcohol use: No    Comment: History of heavy use, remote   . Drug use: Not Currently    Types: Marijuana    Comment: 01/31/18-states he quit all that  . Sexual activity: Not Currently    Birth control/protection: None  Other Topics Concern  . Not on file  Social History Narrative  . Not on file   Social Determinants of Health   Financial Resource Strain:   . Difficulty of Paying  Living Expenses: Not on file  Food Insecurity:   . Worried About Charity fundraiser in the Last Year: Not on file  . Ran Out of Food in the Last Year: Not on file  Transportation Needs:   . Lack of Transportation (Medical): Not on file  . Lack of Transportation (Non-Medical): Not on file  Physical Activity:   . Days of Exercise per Week: Not on file  . Minutes of Exercise per Session: Not on file  Stress:   . Feeling of Stress : Not on file  Social Connections:   . Frequency of Communication with Friends and Family: Not on file  . Frequency of Social Gatherings with Friends and Family: Not on file  . Attends Religious Services: Not on file  . Active Member of Clubs or Organizations: Not on file  . Attends Archivist Meetings: Not on file  . Marital Status: Not on file    Labs: Hepatitis C Lab Results  Component Value Date   HCVGENOTYPE 1a  11/02/2016   HEPCAB REACTIVE (A) 08/15/2019   HCVRNAPCRQN 2,360,000 (H) 08/15/2019   HCVRNAPCRQN 2,000,000 (H) 11/02/2016   FIBROSTAGE F1-F2 10/23/2019   Hepatitis B Lab Results  Component Value Date   HEPBSAB NON-REACTIVE 08/15/2019   HEPBSAG NON-REACTIVE 08/15/2019   HEPBCAB NON-REACTIVE 08/15/2019   Hepatitis A Lab Results  Component Value Date   HAV REACTIVE (A) 08/15/2019   HIV Lab Results  Component Value Date   HIV REPEATEDLY REACTIVE (A) 08/15/2019   HIV REPEATEDLY REACTIVE (A) 03/17/2017   Lab Results  Component Value Date   CREATININE 1.28 (H) 08/15/2019   CREATININE 1.27 (H) 11/02/2016   Lab Results  Component Value Date   AST 84 (H) 08/15/2019   AST 214 (H) 11/02/2016   ALT 77 (H) 10/23/2019   ALT 58 (H) 08/15/2019   ALT 149 (H) 11/02/2016   INR 1.1 10/23/2019   INR 1.2 (H) 11/02/2016    Assessment: Noah Robinson presents to clinic today with his daughter Noah Robinson for his one month Hepatitis C follow-up. He has been taking Epclusa everyday without any noticeable side effects. He also states the [name] is going well without any missed doses or adverse effects noted. Will check HCV RNA today and have him follow-up with [facility] for end of treatment since he had elevated kilopascal measuement on FibroScan  and cirrhosis on liver MRI from September. Of note, he also is scheduled to see GI next week. He has already started his second month of Epclusa.    I updated his medication list today. Of note, he is not taking pantoprazole. Since his initial visit, he has significantly increased pain in his right foot. He went to [facility] ED on 10/28 and received tramadol which has provided him relief. He believes it is due to a gout flare. His daughter asked if the possible gout flare was from the [place] or [place], and I told them that would be unlikely. I have not seen that before nor could I find any case reports on it. Noah Robinson did state he missed a couple doses of  Epclusa when he was in the ED. He will see his PCP ([facility] in [place]) on 12/4 to further discuss the foot pain and gout flare. I told him if he utilized Tylenol for pain relief or for its anti-inflammatory properties to limit dosages to < 2g per day given his evidence for cirrhosis.   Will defer LFT/CBC check to GI next week.  Would also consider revaccinating against Hepatitis B virus given non-reactive serologies.    Plan: Check HCV RNA today Follow-up with [facility] on 04/10/20 @ 10:15AM  Alfonse Spruce, PharmD PGY2 ID Pharmacy Resident [phone]  01/21/2020, 1:40 PM

## 2020-01-22 MED FILL — EPCLUSA 400 MG-100 MG TAB: 400-100 | 28 days supply | Qty: 28 | Fill #2

## 2020-01-23 LAB — HEPATITIS C RNA QUANTITATIVE
HCV Quantitative Log: <1.18 log IU/mL
HCV RNA, PCR, QN: <15 IU/mL

## 2020-01-29 ENCOUNTER — Other Ambulatory Visit: Payer: Self-pay

## 2020-01-29 ENCOUNTER — Ambulatory Visit: Payer: Medicare Other | Admitting: Gastroenterology

## 2020-01-29 NOTE — Progress Notes (Deleted)
Gastroenterology Consultation  Referring Provider:     Gleed Callas, NP Primary Care Physician:  Barry Dienes, NP Primary Gastroenterologist:  Dr. Allen Norris     Reason for Consultation:     Cirrhosis        HPI:   Noah Robinson is a 74 y.o. y/o male referred for consultation & management of cirrhosis by Dr. Barry Dienes, NP.  This patient comes in today with a history of cirrhosis.  The patient was followed by infectious disease for coinfection with hepatitis C and HIV.  The patient had been treated and his last viral load on November 22 of this year was undetectable.  The patient was treated with Epclusa by infectious disease.  The patient has been following up with [facility] for many years prior to coming to see me today.  The patient's alpha-fetoprotein was elevated at 17 and he underwent a ultrasound and a MRI which was consistent with cirrhosis without any focal abnormalities.  The report not mention any signs of portal hypertension such as ascites or splenomegaly.  The patient's platelets were slightly low at 129.  Past Medical History:  Diagnosis Date  . Chronic kidney disease   . Cirrhosis ([place])   . Colon cancer ([place])    per patient recollection  . Enlarged prostate   . Gout   . HCV (hepatitis C virus)   . Hepatitis B core antibody positive   . HIV (human immunodeficiency virus infection) ([place])    Dx 2019 approx, CD4 nadir 323.   Marland Kitchen Hyperlipidemia   . Hypertension     Past Surgical History:  Procedure Laterality Date  . BIOPSY  06/28/2017   Procedure: BIOPSY;  Surgeon: Danie Binder, MD;  [facility];  Service: Endoscopy;;  gastric bx's  . COLONOSCOPY     about 10 years ago  . COLONOSCOPY WITH PROPOFOL N/A 06/28/2017   Procedure: COLONOSCOPY WITH PROPOFOL;  Surgeon: Danie Binder, MD;  [facility];  Service: Endoscopy;  Laterality: N/A;  9:15am  . ESOPHAGOGASTRODUODENOSCOPY (EGD) WITH PROPOFOL N/A 06/28/2017   Procedure:  ESOPHAGOGASTRODUODENOSCOPY (EGD) WITH PROPOFOL;  Surgeon: Danie Binder, MD;  [facility];  Service: Endoscopy;  Laterality: N/A;  . POLYPECTOMY  06/28/2017   Procedure: POLYPECTOMY;  Surgeon: Danie Binder, MD;  [facility];  Service: Endoscopy;;  transverse colon polyps x2, descending colon polyp    Prior to Admission medications   Medication Sig Start Date End Date Taking? Authorizing Provider  allopurinol (ZYLOPRIM) 100 MG tablet Take 100 mg by mouth daily.    [provider]  bictegravir-emtricitabine-tenofovir AF (BIKTARVY) 50-200-25 MG TABS tablet Take 1 tablet by mouth daily. 10/23/19   Logan Callas, NP  lisinopril (ZESTRIL) 5 MG tablet Take 5 mg by mouth daily. 09/01/19   [provider]  mirtazapine (REMERON) 7.5 MG tablet Take 7.5 mg by mouth at bedtime.    [provider]  Sofosbuvir-Velpatasvir (EPCLUSA) 400-100 MG TABS Take 1 tablet by mouth daily. 11/27/19   Kuppelweiser, Cassie L, RPH-CPP  traMADol (ULTRAM) 50 MG tablet Take 1 tablet by mouth 2 (two) times daily. 07/31/19   [provider]    Family History  Problem Relation Age of Onset  . Diabetes Mother   . Healthy Daughter   . Colon cancer Neg Hx      Social History   Tobacco Use  . Smoking status: Never Smoker  . Smokeless tobacco: Never Used  Vaping Use  . Vaping Use: Never used  Substance Use Topics  . Alcohol use: No    Comment: History of heavy use, remote   . Drug use: Not Currently    Types: Marijuana    Comment: 01/31/18-states he quit all that    Allergies as of 01/29/2020 - Review Complete 10/23/2019  Allergen Reaction Noted  . Pantoprazole Hives 09/06/2019    Review of Systems:    All systems reviewed and negative except where noted in HPI.   Physical Exam:  There were no vitals taken for this visit. No LMP for male patient. General:   Alert,  Well-developed, well-nourished, pleasant and cooperative in NAD Head:  Normocephalic  and atraumatic. Eyes:  Sclera clear, no icterus.   Conjunctiva pink. Ears:  Normal auditory acuity. Neck:  Supple; no masses or thyromegaly. Lungs:  Respirations even and unlabored.  Clear throughout to auscultation.   No wheezes, crackles, or rhonchi. No acute distress. Heart:  Regular rate and rhythm; no murmurs, clicks, rubs, or gallops. Abdomen:  Normal bowel sounds.  No bruits.  Soft, non-tender and non-distended without masses, hepatosplenomegaly or hernias noted.  No guarding or rebound tenderness.  Negative Carnett sign.   Rectal:  Deferred.  Pulses:  Normal pulses noted. Extremities:  No clubbing or edema.  No cyanosis. Neurologic:  Alert and oriented x3;  grossly normal neurologically. Skin:  Intact without significant lesions or rashes.  No jaundice. Lymph Nodes:  No significant cervical adenopathy. Psych:  Alert and cooperative. Normal mood and affect.  Imaging Studies: No results found.  Assessment and Plan:   Noah Robinson is a 74 y.o. y/o male ***    Lucilla Lame, MD. Marval Regal    Note: This dictation was prepared with Dragon dictation along with smaller phrase technology. Any transcriptional errors that result from this process are unintentional.

## 2020-03-22 ENCOUNTER — Other Ambulatory Visit: Payer: Self-pay

## 2020-03-22 ENCOUNTER — Emergency Department
Admission: EM | Admit: 2020-03-22 | Discharge: 2020-03-22 | Disposition: A | Discharge status: Home / Self Care | Payer: Medicare Other | Attending: Emergency Medicine | Admitting: Emergency Medicine

## 2020-03-22 ENCOUNTER — Emergency Department: Payer: Medicare Other

## 2020-03-22 DIAGNOSIS — M10341 Gout due to renal impairment, right hand: Secondary | ICD-10-CM | POA: Insufficient documentation

## 2020-03-22 DIAGNOSIS — N189 Chronic kidney disease, unspecified: Secondary | ICD-10-CM | POA: Insufficient documentation

## 2020-03-22 DIAGNOSIS — I129 Hypertensive chronic kidney disease with stage 1 through stage 4 chronic kidney disease, or unspecified chronic kidney disease: Secondary | ICD-10-CM | POA: Diagnosis not present

## 2020-03-22 DIAGNOSIS — Z85038 Personal history of other malignant neoplasm of large intestine: Secondary | ICD-10-CM | POA: Insufficient documentation

## 2020-03-22 DIAGNOSIS — Z79899 Other long term (current) drug therapy: Secondary | ICD-10-CM | POA: Diagnosis not present

## 2020-03-22 DIAGNOSIS — R600 Localized edema: Secondary | ICD-10-CM | POA: Diagnosis not present

## 2020-03-22 DIAGNOSIS — B20 Human immunodeficiency virus [HIV] disease: Secondary | ICD-10-CM | POA: Diagnosis not present

## 2020-03-22 LAB — CBC WITH DIFFERENTIAL/PLATELET
Abs Immature Granulocytes: 0.04 10*3/uL (ref 0.00–0.07)
Basophils Absolute: 0 10*3/uL (ref 0.0–0.1)
Basophils Relative: 0 %
Eosinophils Absolute: 0.1 10*3/uL (ref 0.0–0.5)
Eosinophils Relative: 1 %
HCT: 26.8 % — ABNORMAL LOW (ref 39.0–52.0)
Hemoglobin: 8.6 g/dL — ABNORMAL LOW (ref 13.0–17.0)
Immature Granulocytes: 1 %
Lymphocytes Relative: 13 %
Lymphs Abs: 1 10*3/uL (ref 0.7–4.0)
MCH: 30.5 pg (ref 26.0–34.0)
MCHC: 32.1 g/dL (ref 30.0–36.0)
MCV: 95 fL (ref 80.0–100.0)
Monocytes Absolute: 0.8 10*3/uL (ref 0.1–1.0)
Monocytes Relative: 10 %
Neutro Abs: 6.1 10*3/uL (ref 1.7–7.7)
Neutrophils Relative %: 75 %
Platelets: 219 10*3/uL (ref 150–400)
RBC: 2.82 MIL/uL — ABNORMAL LOW (ref 4.22–5.81)
RDW: 13.5 % (ref 11.5–15.5)
WBC: 8 10*3/uL (ref 4.0–10.5)
nRBC: 0 % (ref 0.0–0.2)

## 2020-03-22 LAB — COMPREHENSIVE METABOLIC PANEL
ALT: 33 U/L (ref 0–44)
AST: 48 U/L — ABNORMAL HIGH (ref 15–41)
Albumin: 2.9 g/dL — ABNORMAL LOW (ref 3.5–5.0)
Alkaline Phosphatase: 68 U/L (ref 38–126)
Anion gap: 10 (ref 5–15)
BUN: 30 mg/dL — ABNORMAL HIGH (ref 8–23)
CO2: 22 mmol/L (ref 22–32)
Calcium: 9 mg/dL (ref 8.9–10.3)
Chloride: 102 mmol/L (ref 98–111)
Creatinine, Ser: 1.86 mg/dL — ABNORMAL HIGH (ref 0.61–1.24)
GFR, Estimated: 38 mL/min — ABNORMAL LOW (ref >60)
Glucose, Bld: 96 mg/dL (ref 70–99)
Potassium: 4.8 mmol/L (ref 3.5–5.1)
Sodium: 134 mmol/L — ABNORMAL LOW (ref 135–145)
Total Bilirubin: 1.2 mg/dL (ref 0.3–1.2)
Total Protein: 8.7 g/dL — ABNORMAL HIGH (ref 6.5–8.1)

## 2020-03-22 LAB — URIC ACID: Uric Acid, Serum: 9.9 mg/dL — ABNORMAL HIGH (ref 3.7–8.6)

## 2020-03-22 LAB — LACTIC ACID, PLASMA: Lactic Acid, Venous: 1.1 mmol/L (ref 0.5–1.9)

## 2020-03-22 MED ORDER — COLCHICINE 0.6 MG PO TABS
0.6000 mg | ORAL_TABLET | Freq: Every day | ORAL | 1 refills | Status: DC
Start: 2020-03-22 — End: 2020-08-05

## 2020-03-22 MED ORDER — COLCHICINE 0.6 MG PO TABS
1.8000 mg | ORAL_TABLET | Freq: Once | ORAL | Status: AC
  Administered 2020-03-22: 1.8 mg via ORAL
  Filled 2020-03-22 (×2): qty 3

## 2020-03-22 NOTE — ED Notes (Addendum)
Pt daughter coming to pick pt up from lobby. Given rx and d/c paperwork and verbalized understanding.

## 2020-03-22 NOTE — ED Triage Notes (Signed)
First nurse note: Per EMS pt has right hand swelling and redness. Also c/o diarrhea x2 weeks.

## 2020-03-22 NOTE — ED Provider Notes (Signed)
[facility] Emergency Department Provider Note  ____________________________________________  Time seen: Approximately 4:33 PM  I have reviewed the triage vital signs and the nursing notes.   HISTORY  Chief Complaint Hand Injury    HPI Noah Robinson is a 75 y.o. male who presents the emergency department complaining of edema and pain to the left hand.  Patient states that 3 weeks ago he had fallen and reportedly fractured the distal aspect of the metacarpal proximal to the index finger.  Patient states that since then he has developed more pain and edema over the third, fourth, fifth metacarpal region with extensive edema.  Patient has a history of gout, CKD, hepatitis C, hepatitis B, HIV.  Patient states that the area is painful similar to a gout flare but he is unsure whether this may be infection versus gout versus a complication of his fracture.  Patient denies any other injury or complaint currently.  Not taking any medications for this complaint prior to arrival.         Past Medical History:  Diagnosis Date  . Chronic kidney disease   . Cirrhosis ([place])   . Colon cancer ([place])    per patient recollection  . Enlarged prostate   . Gout   . HCV (hepatitis C virus)   . Hepatitis B core antibody positive   . HIV (human immunodeficiency virus infection) ([place])    Dx 2019 approx, CD4 nadir 323.   Marland Kitchen Hyperlipidemia   . Hypertension     Patient Active Problem List   Diagnosis Date Noted  . Thrombocytopenia ([place]) 09/06/2019  . Gout   . HIV (human immunodeficiency virus infection) ([place])   . Hypertension   . Chronic kidney disease   . Hepatitis B core antibody positive   . H. pylori infection 07/27/2017  . Rectal bleeding 07/27/2017  . HIV disease ([place]) 05/02/2017  . Hepatic cirrhosis ([place]) 05/02/2017  . Anemia 03/03/2017  . Hepatitis C, chronic ([place]) 03/03/2017    Past Surgical History:  Procedure Laterality Date  . BIOPSY  06/28/2017   Procedure:  BIOPSY;  Surgeon: Danie Binder, MD;  [facility];  Service: Endoscopy;;  gastric bx's  . COLONOSCOPY     about 10 years ago  . COLONOSCOPY WITH PROPOFOL N/A 06/28/2017   Procedure: COLONOSCOPY WITH PROPOFOL;  Surgeon: Danie Binder, MD;  [facility];  Service: Endoscopy;  Laterality: N/A;  9:15am  . ESOPHAGOGASTRODUODENOSCOPY (EGD) WITH PROPOFOL N/A 06/28/2017   Procedure: ESOPHAGOGASTRODUODENOSCOPY (EGD) WITH PROPOFOL;  Surgeon: Danie Binder, MD;  [facility];  Service: Endoscopy;  Laterality: N/A;  . POLYPECTOMY  06/28/2017   Procedure: POLYPECTOMY;  Surgeon: Danie Binder, MD;  [facility];  Service: Endoscopy;;  transverse colon polyps x2, descending colon polyp    Prior to Admission medications   Medication Sig Start Date End Date Taking? Authorizing Provider  colchicine 0.6 MG tablet Take 1 tablet (0.6 mg total) by mouth daily. Take 1 tab daily for at least 6 more days. If  Symptoms persist past 6 days continue to use until prescription is finished 03/22/20  Yes Sissy Goetzke, Charline Bills, PA-C  allopurinol (ZYLOPRIM) 100 MG tablet Take 100 mg by mouth daily.    [provider]  bictegravir-emtricitabine-tenofovir AF (BIKTARVY) 50-200-25 MG TABS tablet Take 1 tablet by mouth daily. 10/23/19   [place] Callas, NP  lisinopril (ZESTRIL) 5 MG tablet Take 5 mg by mouth daily. 09/01/19  [provider]  mirtazapine (REMERON) 7.5 MG tablet Take 7.5 mg by mouth at bedtime.    [provider]  Sofosbuvir-Velpatasvir (EPCLUSA) 400-100 MG TABS Take 1 tablet by mouth daily. 11/27/19   Kuppelweiser, Cassie L, RPH-CPP  traMADol (ULTRAM) 50 MG tablet Take 1 tablet by mouth 2 (two) times daily. 07/31/19   [provider]    Allergies Pantoprazole  Family History  Problem Relation Age of Onset  . Diabetes Mother   . Healthy Daughter   . Colon cancer Neg Hx     Social History Social History   Tobacco Use  .  Smoking status: Never Smoker  . Smokeless tobacco: Never Used  Vaping Use  . Vaping Use: Never used  Substance Use Topics  . Alcohol use: No    Comment: History of heavy use, remote   . Drug use: Not Currently    Types: Marijuana    Comment: 01/31/18-states he quit all that     Review of Systems  Constitutional: No fever/chills Eyes: No visual changes. No discharge ENT: No upper respiratory complaints. Cardiovascular: no chest pain. Respiratory: no cough. No SOB. Gastrointestinal: No abdominal pain.  No nausea, no vomiting.  No diarrhea.  No constipation. Musculoskeletal: Positive for pain, edema of the left hand.  Previous injury 3 weeks ago.  Pain is over the third, fourth, fifth metacarpals where he injured the second metacarpal. Skin: Negative for rash, abrasions, lacerations, ecchymosis. Neurological: Negative for headaches, focal weakness or numbness.  10 System ROS otherwise negative.  ____________________________________________   PHYSICAL EXAM:  VITAL SIGNS: ED Triage Vitals  Enc Vitals Group     BP 03/22/20 1411 140/72     Pulse Rate 03/22/20 1411 95     Resp 03/22/20 1411 18     Temp 03/22/20 1411 98.7 F (37.1 C)     Temp Source 03/22/20 1411 Oral     SpO2 03/22/20 1411 100 %     Weight 03/22/20 1408 160 lb (72.6 kg)     Height 03/22/20 1408 5\' 9"  (1.753 m)     Head Circumference --      Peak Flow --      Pain Score 03/22/20 1408 8     Pain Loc --      Pain Edu? --      Excl. in [place]? --      Constitutional: Alert and oriented. Well appearing and in no acute distress. Eyes: Conjunctivae are normal. PERRL. EOMI. Head: Atraumatic. ENT:      Ears:       Nose: No congestion/rhinnorhea.      Mouth/Throat: Mucous membranes are moist.  Neck: No stridor.    Cardiovascular: Normal rate, regular rhythm. Normal S1 and S2.  Good peripheral circulation. Respiratory: Normal respiratory effort without tachypnea or retractions. Lungs CTAB. Good air entry to the  bases with no decreased or absent breath sounds. Musculoskeletal: Full range of motion to all extremities. No gross deformities appreciated.  Visualization of the left hand reveals gross edema of the left hand.  Patient has edema along the dorsal aspect of the hand with warmth to palpation.  Area is very tender to palpation diffusely over the third, fourth, fifth metacarpal region.  Patient has a Velcro splint with finger splint on the second digit.  Limited range of motion of the third, fourth, fifth digits due to pain.  Sensation intact all digits.  Capillary refill less than 2 seconds all digits.  No extension of tenderness into the  carpals or wrist. Neurologic:  Normal speech and language. No gross focal neurologic deficits are appreciated.  Skin:  Skin is warm, dry and intact. No rash noted. Psychiatric: Mood and affect are normal. Speech and behavior are normal. Patient exhibits appropriate insight and judgement.   ____________________________________________   LABS (all labs ordered are listed, but only abnormal results are displayed)  Labs Reviewed  COMPREHENSIVE METABOLIC PANEL - Abnormal; Notable for the following components:      Result Value   Sodium 134 (*)    BUN 30 (*)    Creatinine, Ser 1.86 (*)    Total Protein 8.7 (*)    Albumin 2.9 (*)    AST 48 (*)    GFR, Estimated 38 (*)    All other components within normal limits  CBC WITH DIFFERENTIAL/PLATELET - Abnormal; Notable for the following components:   RBC 2.82 (*)    Hemoglobin 8.6 (*)    HCT 26.8 (*)    All other components within normal limits  URIC ACID - Abnormal; Notable for the following components:   Uric Acid, Serum 9.9 (*)    All other components within normal limits  LACTIC ACID, PLASMA  LACTIC ACID, PLASMA   ____________________________________________  EKG   ____________________________________________  RADIOLOGY I personally viewed and evaluated these images as part of my medical decision  making, as well as reviewing the written report by the radiologist.  ED Provider Interpretation: No evidence of fracture.  Significant soft tissue edema appreciated on imaging.  Radiopaque foreign body and tissue identified as well.  DG Hand Complete Left  Result Date: 03/22/2020 CLINICAL DATA:  Status post injury 2 weeks ago. EXAM: LEFT HAND - COMPLETE 3+ VIEW COMPARISON:  None. FINDINGS: There is no evidence of fracture or dislocation. Degenerative joint changes of the first and second metacarpal phalangeal joint are noted. IMPRESSION: No acute fracture or dislocation. Electronically Signed   By: Abelardo Diesel M.D.   On: 03/22/2020 14:48    ____________________________________________    PROCEDURES  Procedure(s) performed:    Procedures    Medications  colchicine tablet 1.8 mg (1.8 mg Oral Given 03/22/20 1844)     ____________________________________________   INITIAL IMPRESSION / ASSESSMENT AND PLAN / ED COURSE  Pertinent labs & imaging results that were available during my care of the patient were reviewed by me and considered in my medical decision making (see chart for details).  Review of the [place] CSRS was performed in accordance of the [place] prior to dispensing any controlled drugs.           Patient's diagnosis is consistent with gout. Patient presented to the emergency department complaining of left hand pain, swelling. Patient states that he had a reported injury 3 weeks ago. He was diagnosed with a fracture at urgent care. X-ray today revealed no evidence of fracture. There was significant soft tissue edema and possible foreign body identified on the x-ray. It appears that this soft tissue foreign body is old as there is no soft tissue injury visible to the external hand at this time. On my examination concern was infection versus gout with patient having a history of gout. I felt based off the presentation that it was likely gout as he had had pain and edema for 3  weeks without extension into the forearm or fingers. Patient's white blood cell count and lactic is reassuring. Uric acid is elevated. Patient also has slight bump in his creatinine which would also likely explain the onset of gout.  Patient is encouraged to hydrate at home. I will start the patient on colchicine for his gout. Follow-up with primary care as needed. Return precautions discussed with the patient. Patient is given ED precautions to return to the ED for any worsening or new symptoms.     ____________________________________________  FINAL CLINICAL IMPRESSION(S) / ED DIAGNOSES  Final diagnoses:  Acute gout due to renal impairment involving right hand      NEW MEDICATIONS STARTED DURING THIS VISIT:  ED Discharge Orders         Ordered    colchicine 0.6 MG tablet  Daily        03/22/20 1845              This chart was dictated using voice recognition software/Dragon. Despite best efforts to proofread, errors can occur which can change the meaning. Any change was purely unintentional.    Darletta Moll, PA-C 03/22/20 1847    Carrie Mew, MD 03/22/20 [phone]

## 2020-03-22 NOTE — ED Triage Notes (Signed)
Pt via EMS from home. Pt c/o L hand swelling. Pt had a fall and broke his L hand approx 3 weeks ago. Pt states that he has been keeping it elevated. Pt has only been taking OTC Tylenol with no relief. Pt A&Ox4 and NAD.

## 2020-04-10 ENCOUNTER — Ambulatory Visit (INDEPENDENT_AMBULATORY_CARE_PROVIDER_SITE_OTHER): Payer: Medicare Other | Admitting: Infectious Diseases

## 2020-04-10 ENCOUNTER — Other Ambulatory Visit: Payer: Self-pay

## 2020-04-10 ENCOUNTER — Encounter: Payer: Self-pay | Admitting: Infectious Diseases

## 2020-04-10 VITALS — BP 151/83 | HR 87 | Temp 98.1°F | Wt 149.0 lb

## 2020-04-10 DIAGNOSIS — Z21 Asymptomatic human immunodeficiency virus [HIV] infection status: Secondary | ICD-10-CM

## 2020-04-10 DIAGNOSIS — B20 Human immunodeficiency virus [HIV] disease: Secondary | ICD-10-CM

## 2020-04-10 DIAGNOSIS — M109 Gout, unspecified: Secondary | ICD-10-CM

## 2020-04-10 DIAGNOSIS — L299 Pruritus, unspecified: Secondary | ICD-10-CM

## 2020-04-10 DIAGNOSIS — B182 Chronic viral hepatitis C: Secondary | ICD-10-CM | POA: Diagnosis not present

## 2020-04-10 DIAGNOSIS — B1920 Unspecified viral hepatitis C without hepatic coma: Secondary | ICD-10-CM

## 2020-04-10 DIAGNOSIS — Z79899 Other long term (current) drug therapy: Secondary | ICD-10-CM

## 2020-04-10 DIAGNOSIS — K7460 Unspecified cirrhosis of liver: Secondary | ICD-10-CM

## 2020-04-10 DIAGNOSIS — N1832 Chronic kidney disease, stage 3b: Secondary | ICD-10-CM

## 2020-04-10 DIAGNOSIS — N183 Chronic kidney disease, stage 3 unspecified: Secondary | ICD-10-CM

## 2020-04-10 MED ORDER — ALLOPURINOL 100 MG PO TABS: 300.0000 mg | ORAL_TABLET | Freq: Every day | ORAL | 1 refills | Status: DC

## 2020-04-10 NOTE — Progress Notes (Addendum)
Name: Noah Robinson  DOB: [birth year] MRN: [id number] PCP: Barry Dienes, NP    Brief Narrative:  Noah Robinson is a 75 y.o. male with HIV disease, AIDS (-) with CD4 nadir 323.  HIV Risk: heterosexual  History of OIs: none Intake Labs 07/2019: Hep B sAg (-), sAb (-), cAb (+); Hep A (-), Hep C (+) Quantiferon (-) HLA B*5701 (-) G6PD: ()   Previous Regimens: . Biktarvy  Genotypes: . 07/2019 - wildtype  Subjective:   Chief Complaint  Patient presents with  . Follow-up     B20     HPI: Noah Robinson is here with his daughter for follow up are. He has had several ER visits recently for various things related to dehydration in the setting of diarrhea, gout flares (on pretty constant allopurinol and colchicine with burst prednisone in between); falls and UTI visits as well. He fractured the left index finger at one of these falls. He has had recurrent swelling of the last 3 fingers over the knuckles since that he feels is more c/w gout pain.   He states he is feeling a little better than he has over the last weeks. Diarrhea is improved overall in frequency. He is taking 200 mg allopurinol and colchicine everyday. Trying to not take it causes a flare up in his gout (affecting feet and left hand). They are trying to ensure a favorable diet that will hopefully not flare this for him again.   Finished the [place] in beginning of January. He has been living with his daughter so she is certain he took all doses. No side effects from what was observed. Continues to take Vandervoort everyday without missed doses.   His daughter also states he has had some itching that she attributed to tylenol.   Review of Systems  Constitutional: Positive for activity change. Negative for appetite change, fatigue and unexpected weight change.  HENT: Negative for sore throat.   Respiratory: Negative for chest tightness, shortness of breath and wheezing.   Cardiovascular: Negative for chest pain, palpitations and leg  swelling.  Gastrointestinal: Positive for blood in stool and constipation (intermittently). Negative for abdominal distention, abdominal pain, diarrhea and rectal pain.  Genitourinary: Positive for difficulty urinating (enlarged prostate ). Negative for genital sores and penile discharge.  Musculoskeletal: Negative for arthralgias and joint swelling.  Skin: Negative for rash.  Neurological: Negative for dizziness, light-headedness and numbness.  Hematological: Negative for adenopathy.     Past Medical History:  Diagnosis Date  . Chronic kidney disease   . Cirrhosis ([place])   . Colon cancer ([place])    per patient recollection  . Enlarged prostate   . Gout   . HCV (hepatitis C virus)   . Hepatitis B core antibody positive   . HIV (human immunodeficiency virus infection) ([place])    Dx 2019 approx, CD4 nadir 323.   Marland Kitchen Hyperlipidemia   . Hypertension     Outpatient Medications Prior to Visit  Medication Sig Dispense Refill  . bictegravir-emtricitabine-tenofovir AF (BIKTARVY) 50-200-25 MG TABS tablet Take 1 tablet by mouth daily. 90 tablet 3  . colchicine 0.6 MG tablet Take 1 tablet (0.6 mg total) by mouth daily. Take 1 tab daily for at least 6 more days. If  Symptoms persist past 6 days continue to use until prescription is finished 20 tablet 1  . lisinopril (ZESTRIL) 5 MG tablet Take 5 mg by mouth daily.    Marland Kitchen allopurinol (ZYLOPRIM) 100 MG tablet Take 100 mg by mouth  daily.    . mirtazapine (REMERON) 7.5 MG tablet Take 7.5 mg by mouth at bedtime. (Patient not taking: Reported on 04/10/2020)    . Sofosbuvir-Velpatasvir (EPCLUSA) 400-100 MG TABS Take 1 tablet by mouth daily. (Patient not taking: Reported on 04/10/2020) 28 tablet 2  . traMADol (ULTRAM) 50 MG tablet Take 1 tablet by mouth 2 (two) times daily. (Patient not taking: Reported on 04/10/2020)     No facility-administered medications prior to visit.     Allergies  Allergen Reactions  . Pantoprazole Hives    Social History    Tobacco Use  . Smoking status: Never Smoker  . Smokeless tobacco: Never Used  Vaping Use  . Vaping Use: Never used  Substance Use Topics  . Alcohol use: No    Comment: History of heavy use, remote   . Drug use: Not Currently    Types: Marijuana    Comment: 01/31/18-states he quit all that    Family History  Problem Relation Age of Onset  . Diabetes Mother   . Healthy Daughter   . Colon cancer Neg Hx     Social History   Substance and Sexual Activity  Sexual Activity Not Currently  . Birth control/protection: None     Objective:   Vitals:   04/10/20 0954  BP: (!) 151/83  Pulse: 87  Temp: 98.1 F (36.7 C)  TempSrc: Oral  Weight: 149 lb (67.6 kg)   Body mass index is 22 kg/m.  Physical Exam Constitutional:      Appearance: Normal appearance. He is well-developed. He is not ill-appearing.     Comments: Thin appearing, well developed. No distress. Pleasant.   HENT:     Mouth/Throat:     Dentition: Normal dentition. No dental abscesses.  Cardiovascular:     Rate and Rhythm: Normal rate and regular rhythm.     Heart sounds: Normal heart sounds.  Pulmonary:     Effort: Pulmonary effort is normal.     Breath sounds: Normal breath sounds.  Abdominal:     General: There is no distension.     Palpations: Abdomen is soft.     Tenderness: There is no abdominal tenderness.     Comments: No findings concerning for cirrhosis on exam   Musculoskeletal:        General: Normal range of motion.  Lymphadenopathy:     Cervical: No cervical adenopathy.  Skin:    General: Skin is warm and dry.     Capillary Refill: Capillary refill takes less than 2 seconds.     Findings: No rash.  Neurological:     Mental Status: He is alert and oriented to person, place, and time.  Psychiatric:        Behavior: Behavior normal.        Judgment: Judgment normal.     Comments: In good spirits today and engaged in care discussion.      Lab Results Lab Results  Component Value  Date   WBC 8.0 03/22/2020   HGB 8.6 (L) 03/22/2020   HCT 26.8 (L) 03/22/2020   MCV 95.0 03/22/2020   PLT 219 03/22/2020    Lab Results  Component Value Date   CREATININE 1.78 (H) 04/10/2020   BUN 21 04/10/2020   NA 139 04/10/2020   K 4.4 04/10/2020   CL 103 04/10/2020   CO2 28 04/10/2020    Lab Results  Component Value Date   ALT 33 03/22/2020   AST 48 (H) 03/22/2020   GGT 43  10/23/2019   ALKPHOS 68 03/22/2020   BILITOT 1.2 03/22/2020    Lab Results  Component Value Date   CHOL 138 08/15/2019   HDL 38 (L) 08/15/2019   LDLCALC 70 08/15/2019   TRIG 206 (H) 08/15/2019   CHOLHDL 3.6 08/15/2019   HIV 1 RNA Quant (copies/mL)  Date Value  08/15/2019 78,600 (H)   CD4 T Cell Abs (/uL)  Date Value  08/15/2019 310 (L)     Assessment & Plan:   Problem List Items Addressed This Visit      Unprioritized   Itching    I don't think the itching is d/t tylenol. I would suspect that it is probably one of the new gout medications. Will see if it persists. Advised daily zyrtec with 12.5 mg benadryl if unrelieved PRN.       HIV disease ([place]) - Primary (Chronic)   Relevant Orders   HIV-1 RNA quant-no reflex-bld   T-helper cell (CD4)- ([facility] only)   HIV (human immunodeficiency virus infection) ([place]) (Chronic)    Doing well on Biktarvy once a day. Will update pertinent labs.  Watch kidney function - he has multiple risk factors for CKD however would probably elect to take him off TAF containing regimen and discuss options at upcoming visit with him.  Return in about 3 months (around 07/08/2020).       Hepatitis C, chronic ([place])    He seems to have tolerated the Epclusa well and taken this correctly. Will check hep c rna today to ensure cleared EOT response. Will repeat SVR12 in 3 months when he returns for care follow up.  Will arrange RUQ ultrasound as well with h/o cirrhosis. Discussed that curing the virus dramatically reduces HCC risk, but does not eliminate it and  would like for him to get surveillance screening with AFP / RUQ [place] Q84m       Hepatic cirrhosis (HCC) (Chronic)    HCC screening discussed. See above.       Gout    Several flares recently. Allopurinol started by PCP. Colchicine started for flare in ER about 2 weeks ago but having some significant GI distress with that. Having a hard time coming off that without re-flaring. Will increase to 300 mg QD and check BMP / eGFR. AKI noted in ER a few weeks back.  He has a visit with PCP in < 1 month       Relevant Medications   allopurinol (ZYLOPRIM) 100 MG tablet   Other Relevant Orders   Uric acid (Completed)   Chronic kidney disease    BMP to calculate eGFR today  ADDENDUM: eGFR calculated 44 mL/min - will discuss about potential to change to either juluca vs dovato for TAF sparing regimen going forward given multiple risk factors for CKD.        Other Visit Diagnoses    Hepatitis C virus infection without hepatic coma, unspecified chronicity       Relevant Orders   Hepatitis C RNA quantitative ([facility])   [place] LIMITED RUQ (LIVER/GB)   Encounter for long-term (current) use of medications       Relevant Orders   Basic metabolic panel (Completed)      SJanene Madeira MSN, NP-C RMarionfor IMagas ArribaPager: [phone]: [phone] 04/11/20  9:56 AM   Patient was seen, examined,treatment plan was discussed with the  Advance Practice Provider.  I have personally reviewed the clinical findings, labs, imaging studies and  management of this patient in detail.  I agree with the documentation, as recorded by the Advance Practice Provider.

## 2020-04-10 NOTE — Patient Instructions (Addendum)
Please continue taking your Biktarvy every day.   Increase the allopruinol to 300 mg once a day. Will check your uric acid levels today.   For itching - would try one zyrtec or claritin to see if that helps with itching. If needed can do 12.5 mg (1/2 of a tablet)   Hold of on tylenol - for pain would do ibuprofen 200 -400 mg (1-2 tablets) with food.    Please plan to come back in 3 months with an ultrasound of your liver done before your next visit.   Gout  Gout is painful swelling of your joints. Gout is a type of arthritis. It is caused by having too much uric acid in your body. Uric acid is a chemical that is made when your body breaks down substances called purines. If your body has too much uric acid, sharp crystals can form and build up in your joints. This causes pain and swelling. Gout attacks can happen quickly and be very painful (acute gout). Over time, the attacks can affect more joints and happen more often (chronic gout). What are the causes?  Too much uric acid in your blood. This can happen because: ? Your kidneys do not remove enough uric acid from your blood. ? Your body makes too much uric acid. ? You eat too many foods that are high in purines. These foods include organ meats, some seafood, and beer.  Trauma or stress. What increases the risk?  Having a family history of gout.  Being male and middle-aged.  Being male and having gone through menopause.  Being very overweight (obese).  Drinking alcohol, especially beer.  Not having enough water in the body (being dehydrated).  Losing weight too quickly.  Having an organ transplant.  Having lead poisoning.  Taking certain medicines.  Having kidney disease.  Having a skin condition called psoriasis. What are the signs or symptoms? An attack of acute gout usually happens in just one joint. The most common place is the big toe. Attacks often start at night. Other joints that may be affected include  joints of the feet, ankle, knee, fingers, wrist, or elbow. Symptoms of an attack may include:  Very bad pain.  Warmth.  Swelling.  Stiffness.  Shiny, red, or purple skin.  Tenderness. The affected joint may be very painful to touch.  Chills and fever. Chronic gout may cause symptoms more often. More joints may be involved. You may also have white or yellow lumps (tophi) on your hands or feet or in other areas near your joints.   How is this treated?  Treatment for this condition has two phases: treating an acute attack and preventing future attacks.  Acute gout treatment may include: ? NSAIDs. ? Steroids. These are taken by mouth or injected into a joint. ? Colchicine. This medicine relieves pain and swelling. It can be given by mouth or through an IV tube.  Preventive treatment may include: ? Taking small doses of NSAIDs or colchicine daily. ? Using a medicine that reduces uric acid levels in your blood. ? Making changes to your diet. You may need to see a food expert (dietitian) about what to eat and drink to prevent gout. Follow these instructions at home: During a gout attack  If told, put ice on the painful area: ? Put ice in a plastic bag. ? Place a towel between your skin and the bag. ? Leave the ice on for 20 minutes, 2-3 times a day.  Raise (elevate) the  painful joint above the level of your heart as often as you can.  Rest the joint as much as possible. If the joint is in your leg, you may be given crutches.  Follow instructions from your doctor about what you cannot eat or drink.   Avoiding future gout attacks  Eat a low-purine diet. Avoid foods and drinks such as: ? Liver. ? Kidney. ? Anchovies. ? Asparagus. ? Herring. ? Mushrooms. ? Mussels. ? Beer.  Stay at a healthy weight. If you want to lose weight, talk with your doctor. Do not lose weight too fast.  Start or continue an exercise plan as told by your doctor. Eating and drinking  Drink  enough fluids to keep your pee (urine) pale yellow.  If you drink alcohol: ? Limit how much you use to:  0-1 drink a day for women.  0-2 drinks a day for men. ? Be aware of how much alcohol is in your drink. In the [place] one drink equals one 12 oz bottle of beer (355 mL), one 5 oz glass of wine (148 mL), or one 1 oz glass of hard liquor (44 mL). General instructions  Take over-the-counter and prescription medicines only as told by your doctor.  Do not drive or use heavy machinery while taking prescription pain medicine.  Return to your normal activities as told by your doctor. Ask your doctor what activities are safe for you.  Keep all follow-up visits as told by your doctor. This is important. Contact a doctor if:  You have another gout attack.  You still have symptoms of a gout attack after 10 days of treatment.  You have problems (side effects) because of your medicines.  You have chills or a fever.  You have burning pain when you pee (urinate).  You have pain in your lower back or belly. Get help right away if:  You have very bad pain.  Your pain cannot be controlled.  You cannot pee. Summary  Gout is painful swelling of the joints.  The most common site of pain is the big toe, but it can affect other joints.  Medicines and avoiding some foods can help to prevent and treat gout attacks. This information is not intended to replace advice given to you by your health care provider. Make sure you discuss any questions you have with your health care provider. Document Revised: 09/07/2017 Document Reviewed: 09/07/2017 Elsevier Patient Education  [place].

## 2020-04-11 ENCOUNTER — Encounter: Payer: Self-pay | Admitting: Infectious Diseases

## 2020-04-11 DIAGNOSIS — L299 Pruritus, unspecified: Secondary | ICD-10-CM | POA: Insufficient documentation

## 2020-04-11 LAB — T-HELPER CELL (CD4) - (RCID CLINIC ONLY)
CD4 % Helper T Cell: 42 % (ref 33–65)
CD4 T Cell Abs: 239 /uL — ABNORMAL LOW (ref 400–1790)

## 2020-04-11 NOTE — Assessment & Plan Note (Addendum)
BMP to calculate eGFR today  ADDENDUM: eGFR calculated 44 mL/min - will discuss about potential to change to either juluca vs dovato for TAF sparing regimen going forward given multiple risk factors for CKD.

## 2020-04-11 NOTE — Assessment & Plan Note (Signed)
[place] screening discussed. See above.

## 2020-04-11 NOTE — Assessment & Plan Note (Signed)
He seems to have tolerated the Epclusa well and taken this correctly. Will check hep c rna today to ensure cleared EOT response. Will repeat SVR12 in 3 months when he returns for care follow up.  Will arrange RUQ ultrasound as well with h/o cirrhosis. Discussed that curing the virus dramatically reduces HCC risk, but does not eliminate it and would like for him to get surveillance screening with AFP / RUQ [place] Q45m.

## 2020-04-11 NOTE — Assessment & Plan Note (Signed)
I don't think the itching is d/t tylenol. I would suspect that it is probably one of the new gout medications. Will see if it persists. Advised daily zyrtec with 12.5 mg benadryl if unrelieved PRN.

## 2020-04-11 NOTE — Assessment & Plan Note (Signed)
Doing well on Biktarvy once a day. Will update pertinent labs.  Watch kidney function - he has multiple risk factors for CKD however would probably elect to take him off TAF containing regimen and discuss options at upcoming visit with him.  Return in about 3 months (around 07/08/2020).

## 2020-04-11 NOTE — Progress Notes (Signed)
Done

## 2020-04-11 NOTE — Assessment & Plan Note (Addendum)
Several flares recently. Allopurinol started by PCP. Colchicine started for flare in ER about 2 weeks ago but having some significant GI distress with that. Having a hard time coming off that without re-flaring. Will increase to 300 mg QD and check BMP / eGFR. AKI noted in ER a few weeks back.  He has a visit with PCP in < 1 month

## 2020-04-13 LAB — BASIC METABOLIC PANEL
BUN/Creatinine Ratio: 12 (calc) (ref 6–22)
BUN: 21 mg/dL (ref 7–25)
CO2: 28 mmol/L (ref 20–32)
Calcium: 9.7 mg/dL (ref 8.6–10.3)
Chloride: 103 mmol/L (ref 98–110)
Creat: 1.78 mg/dL — ABNORMAL HIGH (ref 0.70–1.18)
Glucose, Bld: 95 mg/dL (ref 65–99)
Potassium: 4.4 mmol/L (ref 3.5–5.3)
Sodium: 139 mmol/L (ref 135–146)

## 2020-04-13 LAB — HEPATITIS C RNA QUANTITATIVE
HCV Quantitative Log: <1.18 log IU/mL
HCV RNA, PCR, QN: <15 IU/mL

## 2020-04-13 LAB — HIV-1 RNA QUANT-NO REFLEX-BLD
HIV 1 RNA Quant: <20 Copies/mL
HIV-1 RNA Quant, Log: <1.3 Log cps/mL

## 2020-04-13 LAB — URIC ACID: Uric Acid, Serum: 7.2 mg/dL (ref 4.0–8.0)

## 2020-04-22 NOTE — Progress Notes (Signed)
I called Noah Robinson and his daughter, Noah Robinson to go over labs for him.  Hep C preliminarily looks to be cured - will repeat RNA at next appointment to document SVR.  HIV Vl undetectable.  His gout is better on higher dose Allopurinol 300 mg QD. No longer needing the colchiceine. Diarrhea better.  Has a follow up with PCP in March. Will need to watch kidneys - likely more r/t acute problems with diarrhea/ dehydration. Could consider switch to dovato later for TAF sparing regimen.

## 2020-04-22 NOTE — Progress Notes (Signed)
Patient scheduled for 07-03-20 for ultrasound

## 2020-05-06 ENCOUNTER — Encounter: Payer: Self-pay | Admitting: Gastroenterology

## 2020-05-06 ENCOUNTER — Encounter: Payer: Self-pay | Admitting: Internal Medicine

## 2020-05-06 ENCOUNTER — Other Ambulatory Visit: Payer: Self-pay

## 2020-05-06 ENCOUNTER — Ambulatory Visit (INDEPENDENT_AMBULATORY_CARE_PROVIDER_SITE_OTHER): Payer: Medicare Other | Admitting: Gastroenterology

## 2020-05-06 VITALS — BP 133/68 | HR 96 | Ht 69.0 in | Wt 160.2 lb

## 2020-05-06 DIAGNOSIS — R269 Unspecified abnormalities of gait and mobility: Secondary | ICD-10-CM | POA: Insufficient documentation

## 2020-05-06 DIAGNOSIS — G479 Sleep disorder, unspecified: Secondary | ICD-10-CM | POA: Insufficient documentation

## 2020-05-06 DIAGNOSIS — R197 Diarrhea, unspecified: Secondary | ICD-10-CM

## 2020-05-06 DIAGNOSIS — D509 Iron deficiency anemia, unspecified: Secondary | ICD-10-CM | POA: Insufficient documentation

## 2020-05-06 NOTE — Progress Notes (Signed)
Gastroenterology Consultation  Referring Provider:     Barry Dienes, NP Primary Care Physician:  Barry Dienes, NP Primary Gastroenterologist:  Dr. Allen Norris     Reason for Consultation:     History of hepatitis C        HPI:   Noah Robinson is a 75 y.o. y/o male referred for consultation & management of history of hepatitis C by Dr. Barry Dienes, NP.  This patient was referred to me from infectious disease who is treating this patient for his HIV and it appears the patient has also been treated for hepatitis C with a negative viral load 3 weeks ago and 3 months ago with his positive viral load 8 months ago.  The patient has been found to have cirrhosis and had an MRI without any sign of hepatocellular carcinoma back in September.  The patient has been set up for a right upper quadrant ultrasound in May of this year.  The trending of the patient's liver enzymes have shown:  Component     Latest Ref Rng & Units 11/02/2016 08/15/2019 10/23/2019 03/22/2020  AST     15 - 41 U/L 214 (H) 84 (H)  48 (H)  ALT     0 - 44 U/L 149 (H) 58 (H) 77 (H) 33  Alkaline Phosphatase     38 - 126 U/L 114   68  Total Bilirubin     0.3 - 1.2 mg/dL 0.8 0.5  1.2   The patient has been followed by [facility] since 2018 and now comes to see me.  He has been diagnosed and treated for H. pylori.  The patient had stated he was getting get tested for clearance in [place] which is closer to his house. The patient and his daughter are not sure why they are here since the ID doctor has been taking care of everything and his HCV has been cured.  The patient did have some diarrhea back in December but his colchicine was decreased and his diarrhea had improved.  Past Medical History:  Diagnosis Date  . Chronic kidney disease   . Cirrhosis ([place])   . Colon cancer ([place])    per patient recollection  . Enlarged prostate   . Gout   . HCV (hepatitis C virus)   . Hepatitis B core antibody positive   . HIV (human  immunodeficiency virus infection) ([place])    Dx 2019 approx, CD4 nadir 323.   Marland Kitchen Hyperlipidemia   . Hypertension     Past Surgical History:  Procedure Laterality Date  . BIOPSY  06/28/2017   Procedure: BIOPSY;  Surgeon: Danie Binder, MD;  [facility];  Service: Endoscopy;;  gastric bx's  . COLONOSCOPY     about 10 years ago  . COLONOSCOPY WITH PROPOFOL N/A 06/28/2017   Procedure: COLONOSCOPY WITH PROPOFOL;  Surgeon: Danie Binder, MD;  Location: [facility];  Service: Endoscopy;  Laterality: N/A;  9:15am  . ESOPHAGOGASTRODUODENOSCOPY (EGD) WITH PROPOFOL N/A 06/28/2017   Procedure: ESOPHAGOGASTRODUODENOSCOPY (EGD) WITH PROPOFOL;  Surgeon: Danie Binder, MD;  [facility];  Service: Endoscopy;  Laterality: N/A;  . POLYPECTOMY  06/28/2017   Procedure: POLYPECTOMY;  Surgeon: Danie Binder, MD;  Location[facility];  Service: Endoscopy;;  transverse colon polyps x2, descending colon polyp    Prior to Admission medications   Medication Sig Start Date End Date Taking? Authorizing Provider  allopurinol (ZYLOPRIM) 100 MG tablet Take 3 tablets (  300 mg total) by mouth daily. 04/10/20   Passapatanzy Callas, NP  bictegravir-emtricitabine-tenofovir AF (BIKTARVY) 50-200-25 MG TABS tablet Take 1 tablet by mouth daily. 10/23/19   [place] Callas, NP  colchicine 0.6 MG tablet Take 1 tablet (0.6 mg total) by mouth daily. Take 1 tab daily for at least 6 more days. If  Symptoms persist past 6 days continue to use until prescription is finished 03/22/20   Cuthriell, Roderic Palau D, PA-C  lisinopril (ZESTRIL) 5 MG tablet Take 5 mg by mouth daily. 09/01/19   [provider]  mirtazapine (REMERON) 7.5 MG tablet Take 7.5 mg by mouth at bedtime. Patient not taking: Reported on 04/10/2020    [provider]  Sofosbuvir-Velpatasvir (EPCLUSA) 400-100 MG TABS Take 1 tablet by mouth daily. Patient not taking: Reported on 04/10/2020 11/27/19   Kuppelweiser, Cassie L, RPH-CPP   traMADol (ULTRAM) 50 MG tablet Take 1 tablet by mouth 2 (two) times daily. Patient not taking: Reported on 04/10/2020 07/31/19   [provider]    Family History  Problem Relation Age of Onset  . Diabetes Mother   . Healthy Daughter   . Colon cancer Neg Hx      Social History   Tobacco Use  . Smoking status: Never Smoker  . Smokeless tobacco: Never Used  Vaping Use  . Vaping Use: Never used  Substance Use Topics  . Alcohol use: No    Comment: History of heavy use, remote   . Drug use: Not Currently    Types: Marijuana    Comment: 01/31/18-states he quit all that    Allergies as of 05/06/2020 - Review Complete 04/11/2020  Allergen Reaction Noted  . Pantoprazole Hives 09/06/2019    Review of Systems:    All systems reviewed and negative except where noted in HPI.   Physical Exam:  There were no vitals taken for this visit. No LMP for male patient. General:   Alert,  Well-developed, well-nourished, pleasant and cooperative in NAD Head:  Normocephalic and atraumatic. Eyes:  Sclera clear, no icterus.   Conjunctiva pink. Ears:  Normal auditory acuity. Neck:  Supple; no masses or thyromegaly. Lungs:  Respirations even and unlabored.  Clear throughout to auscultation.   No wheezes, crackles, or rhonchi. No acute distress. Heart:  Regular rate and rhythm; no murmurs, clicks, rubs, or gallops. Abdomen:  Normal bowel sounds.  No bruits.  Soft, non-tender and non-distended without masses, hepatosplenomegaly or hernias noted.  No guarding or rebound tenderness.  Negative Carnett sign.   Rectal:  Deferred.  Pulses:  Normal pulses noted. Extremities:  No clubbing or edema.  No cyanosis. Neurologic:  Alert and oriented x3;  grossly normal neurologically. Skin:  Intact without significant lesions or rashes.  No jaundice. Lymph Nodes:  No significant cervical adenopathy. Psych:  Alert and cooperative. Normal mood and affect.  Imaging Studies: No results  found.  Assessment and Plan:   RONDEY FALLEN is a 75 y.o. y/o male who comes in with a history of hepatitis C that has been treated.  Patient is followed by ID for his HIV and has been followed by them for hepatocellular carcinoma surveillance.  The patient has had an EGD and colonoscopy both performed back in 2019.  The patient appears to not have been checked for resolution of his H. pylori and will have a stool test sent for H. pylori to document eradication.  Nothing further to do from a GI point of view.  The patient has been  explained the plan and agrees with it.    Lucilla Lame, MD. Marval Regal    Note: This dictation was prepared with Dragon dictation along with smaller phrase technology. Any transcriptional errors that result from this process are unintentional.

## 2020-05-23 ENCOUNTER — Other Ambulatory Visit (HOSPITAL_COMMUNITY): Payer: Self-pay

## 2020-06-17 ENCOUNTER — Encounter: Payer: Self-pay | Admitting: Infectious Diseases

## 2020-07-03 ENCOUNTER — Other Ambulatory Visit: Payer: Self-pay

## 2020-07-03 ENCOUNTER — Ambulatory Visit
Admission: RE | Admit: 2020-07-03 | Discharge: 2020-07-03 | Disposition: A | Discharge status: Home / Self Care | Payer: Medicare Other | Source: Ambulatory Visit | Attending: Infectious Diseases | Admitting: Infectious Diseases

## 2020-07-03 DIAGNOSIS — B1920 Unspecified viral hepatitis C without hepatic coma: Secondary | ICD-10-CM | POA: Diagnosis present

## 2020-07-10 ENCOUNTER — Ambulatory Visit: Payer: Medicare Other | Admitting: Infectious Diseases

## 2020-07-30 ENCOUNTER — Other Ambulatory Visit: Payer: Self-pay

## 2020-07-30 ENCOUNTER — Emergency Department (HOSPITAL_COMMUNITY): Payer: Medicare Other

## 2020-07-30 ENCOUNTER — Inpatient Hospital Stay (HOSPITAL_COMMUNITY)
Admission: EM | Admit: 2020-07-30 | Discharge: 2020-08-05 | DRG: 974 | Disposition: A | Discharge status: Skilled Nursing Facility | Payer: Medicare Other | Source: Skilled Nursing Facility | Attending: Family Medicine | Admitting: Family Medicine

## 2020-07-30 DIAGNOSIS — B182 Chronic viral hepatitis C: Secondary | ICD-10-CM | POA: Diagnosis present

## 2020-07-30 DIAGNOSIS — R6510 Systemic inflammatory response syndrome (SIRS) of non-infectious origin without acute organ dysfunction: Secondary | ICD-10-CM | POA: Diagnosis present

## 2020-07-30 DIAGNOSIS — N179 Acute kidney failure, unspecified: Secondary | ICD-10-CM | POA: Diagnosis present

## 2020-07-30 DIAGNOSIS — R319 Hematuria, unspecified: Secondary | ICD-10-CM | POA: Diagnosis present

## 2020-07-30 DIAGNOSIS — R627 Adult failure to thrive: Secondary | ICD-10-CM | POA: Diagnosis present

## 2020-07-30 DIAGNOSIS — Z6820 Body mass index (BMI) 20.0-20.9, adult: Secondary | ICD-10-CM

## 2020-07-30 DIAGNOSIS — N1831 Chronic kidney disease, stage 3a: Secondary | ICD-10-CM | POA: Diagnosis present

## 2020-07-30 DIAGNOSIS — I129 Hypertensive chronic kidney disease with stage 1 through stage 4 chronic kidney disease, or unspecified chronic kidney disease: Secondary | ICD-10-CM | POA: Diagnosis present

## 2020-07-30 DIAGNOSIS — N189 Chronic kidney disease, unspecified: Secondary | ICD-10-CM | POA: Diagnosis present

## 2020-07-30 DIAGNOSIS — B20 Human immunodeficiency virus [HIV] disease: Secondary | ICD-10-CM | POA: Diagnosis present

## 2020-07-30 DIAGNOSIS — Z8619 Personal history of other infectious and parasitic diseases: Secondary | ICD-10-CM

## 2020-07-30 DIAGNOSIS — F1010 Alcohol abuse, uncomplicated: Secondary | ICD-10-CM | POA: Diagnosis present

## 2020-07-30 DIAGNOSIS — Z888 Allergy status to other drugs, medicaments and biological substances status: Secondary | ICD-10-CM

## 2020-07-30 DIAGNOSIS — K766 Portal hypertension: Secondary | ICD-10-CM | POA: Diagnosis present

## 2020-07-30 DIAGNOSIS — R001 Bradycardia, unspecified: Secondary | ICD-10-CM | POA: Diagnosis present

## 2020-07-30 DIAGNOSIS — M109 Gout, unspecified: Secondary | ICD-10-CM | POA: Diagnosis present

## 2020-07-30 DIAGNOSIS — K2971 Gastritis, unspecified, with bleeding: Secondary | ICD-10-CM | POA: Diagnosis present

## 2020-07-30 DIAGNOSIS — Z79899 Other long term (current) drug therapy: Secondary | ICD-10-CM

## 2020-07-30 DIAGNOSIS — I69354 Hemiplegia and hemiparesis following cerebral infarction affecting left non-dominant side: Secondary | ICD-10-CM | POA: Diagnosis not present

## 2020-07-30 DIAGNOSIS — K3189 Other diseases of stomach and duodenum: Secondary | ICD-10-CM | POA: Diagnosis present

## 2020-07-30 DIAGNOSIS — E871 Hypo-osmolality and hyponatremia: Secondary | ICD-10-CM | POA: Diagnosis present

## 2020-07-30 DIAGNOSIS — N419 Inflammatory disease of prostate, unspecified: Secondary | ICD-10-CM | POA: Diagnosis present

## 2020-07-30 DIAGNOSIS — E785 Hyperlipidemia, unspecified: Secondary | ICD-10-CM | POA: Diagnosis present

## 2020-07-30 DIAGNOSIS — R54 Age-related physical debility: Secondary | ICD-10-CM | POA: Diagnosis present

## 2020-07-30 DIAGNOSIS — D649 Anemia, unspecified: Secondary | ICD-10-CM | POA: Diagnosis not present

## 2020-07-30 DIAGNOSIS — K222 Esophageal obstruction: Secondary | ICD-10-CM | POA: Diagnosis present

## 2020-07-30 DIAGNOSIS — K7460 Unspecified cirrhosis of liver: Secondary | ICD-10-CM | POA: Diagnosis present

## 2020-07-30 DIAGNOSIS — K7030 Alcoholic cirrhosis of liver without ascites: Secondary | ICD-10-CM | POA: Diagnosis present

## 2020-07-30 DIAGNOSIS — R195 Other fecal abnormalities: Secondary | ICD-10-CM | POA: Diagnosis not present

## 2020-07-30 DIAGNOSIS — I8510 Secondary esophageal varices without bleeding: Secondary | ICD-10-CM | POA: Diagnosis present

## 2020-07-30 DIAGNOSIS — N390 Urinary tract infection, site not specified: Secondary | ICD-10-CM | POA: Diagnosis present

## 2020-07-30 DIAGNOSIS — Z20822 Contact with and (suspected) exposure to covid-19: Secondary | ICD-10-CM | POA: Diagnosis present

## 2020-07-30 DIAGNOSIS — A4151 Sepsis due to Escherichia coli [E. coli]: Secondary | ICD-10-CM | POA: Diagnosis present

## 2020-07-30 DIAGNOSIS — A4181 Sepsis due to Enterococcus: Secondary | ICD-10-CM | POA: Diagnosis present

## 2020-07-30 DIAGNOSIS — Z85038 Personal history of other malignant neoplasm of large intestine: Secondary | ICD-10-CM

## 2020-07-30 DIAGNOSIS — Z833 Family history of diabetes mellitus: Secondary | ICD-10-CM

## 2020-07-30 DIAGNOSIS — D62 Acute posthemorrhagic anemia: Secondary | ICD-10-CM | POA: Diagnosis present

## 2020-07-30 DIAGNOSIS — Z87440 Personal history of urinary (tract) infections: Secondary | ICD-10-CM

## 2020-07-30 LAB — COMPREHENSIVE METABOLIC PANEL
ALT: 31 U/L (ref 0–44)
AST: 45 U/L — ABNORMAL HIGH (ref 15–41)
Albumin: 2.5 g/dL — ABNORMAL LOW (ref 3.5–5.0)
Alkaline Phosphatase: 88 U/L (ref 38–126)
Anion gap: 12 (ref 5–15)
BUN: 89 mg/dL — ABNORMAL HIGH (ref 8–23)
CO2: 19 mmol/L — ABNORMAL LOW (ref 22–32)
Calcium: 8.8 mg/dL — ABNORMAL LOW (ref 8.9–10.3)
Chloride: 99 mmol/L (ref 98–111)
Creatinine, Ser: 3.8 mg/dL — ABNORMAL HIGH (ref 0.61–1.24)
GFR, Estimated: 16 mL/min — ABNORMAL LOW (ref >60)
Glucose, Bld: 106 mg/dL — ABNORMAL HIGH (ref 70–99)
Potassium: 4.8 mmol/L (ref 3.5–5.1)
Sodium: 130 mmol/L — ABNORMAL LOW (ref 135–145)
Total Bilirubin: 1 mg/dL (ref 0.3–1.2)
Total Protein: 9.1 g/dL — ABNORMAL HIGH (ref 6.5–8.1)

## 2020-07-30 LAB — POC OCCULT BLOOD, ED: Fecal Occult Bld: POSITIVE — AB

## 2020-07-30 LAB — CBC WITH DIFFERENTIAL/PLATELET
Abs Immature Granulocytes: 0.07 10*3/uL (ref 0.00–0.07)
Basophils Absolute: 0 10*3/uL (ref 0.0–0.1)
Basophils Relative: 0 %
Eosinophils Absolute: 0 10*3/uL (ref 0.0–0.5)
Eosinophils Relative: 0 %
HCT: 21.7 % — ABNORMAL LOW (ref 39.0–52.0)
Hemoglobin: 7.1 g/dL — ABNORMAL LOW (ref 13.0–17.0)
Immature Granulocytes: 1 %
Lymphocytes Relative: 8 %
Lymphs Abs: 1.1 10*3/uL (ref 0.7–4.0)
MCH: 31.8 pg (ref 26.0–34.0)
MCHC: 32.7 g/dL (ref 30.0–36.0)
MCV: 97.3 fL (ref 80.0–100.0)
Monocytes Absolute: 1.3 10*3/uL — ABNORMAL HIGH (ref 0.1–1.0)
Monocytes Relative: 10 %
Neutro Abs: 10.1 10*3/uL — ABNORMAL HIGH (ref 1.7–7.7)
Neutrophils Relative %: 81 %
Platelets: 232 10*3/uL (ref 150–400)
RBC: 2.23 MIL/uL — ABNORMAL LOW (ref 4.22–5.81)
RDW: 15.3 % (ref 11.5–15.5)
WBC: 12.6 10*3/uL — ABNORMAL HIGH (ref 4.0–10.5)
nRBC: 0 % (ref 0.0–0.2)

## 2020-07-30 LAB — URINALYSIS, ROUTINE W REFLEX MICROSCOPIC
Bilirubin Urine: NEGATIVE
Glucose, UA: NEGATIVE mg/dL
Ketones, ur: NEGATIVE mg/dL
Nitrite: NEGATIVE
Protein, ur: NEGATIVE mg/dL
Specific Gravity, Urine: 1.013 (ref 1.005–1.030)
pH: 5 (ref 5.0–8.0)

## 2020-07-30 LAB — RESP PANEL BY RT-PCR (FLU A&B, COVID) ARPGX2
Influenza A by PCR: NEGATIVE
Influenza B by PCR: NEGATIVE
SARS Coronavirus 2 by RT PCR: NEGATIVE

## 2020-07-30 LAB — PREPARE RBC (CROSSMATCH)

## 2020-07-30 LAB — LACTIC ACID, PLASMA: Lactic Acid, Venous: 1.3 mmol/L (ref 0.5–1.9)

## 2020-07-30 LAB — ABO/RH: ABO/RH(D): O POS

## 2020-07-30 MED ORDER — SODIUM CHLORIDE 0.9 % IV BOLUS
1000.0000 mL | Freq: Once | INTRAVENOUS | Status: AC
  Administered 2020-07-30: 1000 mL via INTRAVENOUS

## 2020-07-30 MED ORDER — METRONIDAZOLE 500 MG/100ML IV SOLN
INTRAVENOUS | Status: AC
  Filled 2020-07-30: qty 100

## 2020-07-30 MED ORDER — SODIUM CHLORIDE 0.9 % IV SOLN
10.0000 mL/h | Freq: Once | INTRAVENOUS | Status: AC
  Administered 2020-07-30: 10 mL/h via INTRAVENOUS

## 2020-07-30 MED ORDER — METRONIDAZOLE 500 MG/100ML IV SOLN
500.0000 mg | Freq: Once | INTRAVENOUS | Status: AC
  Administered 2020-07-30: 500 mg via INTRAVENOUS
  Filled 2020-07-30: qty 100

## 2020-07-30 MED ORDER — VANCOMYCIN HCL 750 MG/150ML IV SOLN: 750.0000 mg | INTRAVENOUS | Status: DC

## 2020-07-30 MED ORDER — LACTATED RINGERS IV SOLN: INTRAVENOUS | Status: DC

## 2020-07-30 MED ORDER — SODIUM CHLORIDE 0.9 % IV SOLN
2.0000 g | Freq: Once | INTRAVENOUS | Status: AC
  Administered 2020-07-30: 2 g via INTRAVENOUS
  Filled 2020-07-30: qty 2

## 2020-07-30 MED ORDER — VANCOMYCIN HCL IN DEXTROSE 1-5 GM/200ML-% IV SOLN
1000.0000 mg | Freq: Once | INTRAVENOUS | Status: AC
  Administered 2020-07-30: 1000 mg via INTRAVENOUS
  Filled 2020-07-30: qty 200

## 2020-07-30 MED ORDER — SODIUM CHLORIDE 0.9 % IV SOLN: 2.0000 g | INTRAVENOUS | Status: DC

## 2020-07-30 NOTE — H&P (Signed)
History and Physical    Noah Robinson JXB:[id number] DOB: [birth year] DOA: 07/30/2020  PCP: [facility]   Patient coming from: [place] [facility]  I have personally briefly reviewed patient's old medical records in [place]  Chief Complaint: Low hemoglobin  HPI: Noah Robinson is a 75 y.o. male with medical history significant for HIV, hepatitis C, and liver cirrhosis, CKD 3. Patient was sent to the ED reports of low hemoglobin and elevated white count-hemoglobin of 7.4, white blood count of 11.2.  On my evaluation, patient denies abdominal pain, he denies vomiting blood, he denies blood in stools or black stools.  He denies pain with urination denies chest pain no cough no difficulty breathing.  Recent hospitalization at [facility] 5/14 - 5/27- for AKI on CKD, creatinine up to 8.69 on admission-thought secondary to post obstruction AKI and ATN.  UTI prostatitis E. coli and Enterococcus faecalis initially treated with Zosyn, transition to Levaquin to treat through 6/11 for 4 weeks for prostatitis.  He was also to follow-up with urology as he failed voiding trial, he was started on finasteride and tamsulosin.  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  ED Course: T-max 100.3, tachycardic to 133, respiratory rate 18-22.  Blood pressure systolic 130Q to 657Q.  O2 sats greater than 98% on room air.  WBC 12.6.  Creatinine elevated at 3.8.  Sodium 130.  FOBT positive.  Urine suggestive of UTI.  Chest x-ray unremarkable. EDP reports small streaks of blood on rectal exam, no hemorrhoids or fissures.  IV vancomycin and cefepime started.  1 unit PRBCs ordered for transfusion.  Hospitalist to admit.  Review of Systems: As per HPI all other systems reviewed and negative.  Past Medical History:  Diagnosis Date  . Chronic kidney disease   . Cirrhosis ([place])   . Colon cancer ([place])     per patient recollection  . Enlarged prostate   . Gout   . HCV (hepatitis C virus)   . Hepatitis B core antibody positive   . HIV (human immunodeficiency virus infection) ([place])    Dx 2019 approx, CD4 nadir 323.   Marland Kitchen Hyperlipidemia   . Hypertension     Past Surgical History:  Procedure Laterality Date  . BIOPSY  06/28/2017   Procedure: BIOPSY;  Surgeon: Danie Binder, MD;  [facility];  Service: Endoscopy;;  gastric bx's  . COLONOSCOPY     about 10 years ago  . COLONOSCOPY WITH PROPOFOL N/A 06/28/2017   Procedure: COLONOSCOPY WITH PROPOFOL;  Surgeon: Danie Binder, MD;  [facility];  Service: Endoscopy;  Laterality: N/A;  9:15am  . ESOPHAGOGASTRODUODENOSCOPY (EGD) WITH PROPOFOL N/A 06/28/2017   Procedure: ESOPHAGOGASTRODUODENOSCOPY (EGD) WITH PROPOFOL;  Surgeon: Danie Binder, MD;  [facility];  Service: Endoscopy;  Laterality: N/A;  . POLYPECTOMY  06/28/2017   Procedure: POLYPECTOMY;  Surgeon: Danie Binder, MD;  [facility];  Service: Endoscopy;;  transverse colon polyps x2, descending colon polyp     reports that he has never smoked. He has never used smokeless tobacco. He reports previous drug use. Drug: Marijuana. He reports that he does not drink alcohol.  Allergies  Allergen Reactions  . Pantoprazole Hives    Family History  Problem Relation Age of Onset  . Diabetes Mother   . Healthy Daughter   . Colon cancer Neg Hx  Prior to Admission medications   Medication Sig Start Date End Date Taking? Authorizing Provider  allopurinol (ZYLOPRIM) 100 MG tablet Take 3 tablets (300 mg total) by mouth daily. 04/10/20   [place] Callas, NP  bictegravir-emtricitabine-tenofovir AF (BIKTARVY) 50-200-25 MG TABS tablet Take 1 tablet by mouth daily. 10/23/19    Callas, NP  colchicine 0.6 MG tablet Take 1 tablet (0.6 mg total) by mouth daily. Take 1 tab daily for at least 6 more days. If  Symptoms persist past 6 days  continue to use until prescription is finished 03/22/20   Cuthriell, Roderic Palau D, PA-C  lisinopril (ZESTRIL) 5 MG tablet Take 5 mg by mouth daily. 09/01/19   [provider]  mirtazapine (REMERON) 7.5 MG tablet Take 7.5 mg by mouth at bedtime. Patient not taking: Reported on 04/10/2020    [provider]  Sofosbuvir-Velpatasvir 400-100 MG TABS TAKE 1 TABLET BY MOUTH DAILY. Patient not taking: No sig reported 11/27/19 11/26/20  Kuppelweiser, Cassie L, RPH-CPP  traMADol (ULTRAM) 50 MG tablet Take 1 tablet by mouth 2 (two) times daily. Patient not taking: Reported on 04/10/2020 07/31/19   [provider]    Physical Exam: Vitals:   07/30/20 1930 07/30/20 1951 07/30/20 2000 07/30/20 2100  BP: 136/81  121/68 133/67  Pulse: (!) 133  (!) 120 (!) 115  Resp: 18  (!) 21 18  Temp:  100.3 F (37.9 C)    TempSrc:  Rectal    SpO2: (!) 18%  100% 98%  Weight:      Height:        Constitutional: Chronically ill-appearing, calm, comfortable Vitals:   07/30/20 1930 07/30/20 1951 07/30/20 2000 07/30/20 2100  BP: 136/81  121/68 133/67  Pulse: (!) 133  (!) 120 (!) 115  Resp: 18  (!) 21 18  Temp:  100.3 F (37.9 C)    TempSrc:  Rectal    SpO2: (!) 18%  100% 98%  Weight:      Height:       Eyes: PERRL, lids and conjunctivae normal ENMT: Mucous membranes are dry , with overall poor dentition Neck: normal, supple, no masses, no thyromegaly Respiratory: clear to auscultation bilaterally, no wheezing, no crackles. Normal respiratory effort. No accessory muscle use.  Cardiovascular: Regular rate and rhythm, no murmurs / rubs / gallops. No extremity edema. 2+ pedal pulses.  Abdomen: no tenderness, no masses palpated. No hepatosplenomegaly. Bowel sounds positive.  Musculoskeletal: no clubbing / cyanosis. No joint deformity upper and lower extremities.  Appears to have contractures involving his left upper extremity.  Normal muscle tone.  Skin: no rashes, lesions, ulcers. No  induration Neurologic: No apparent cranial abnormality.  Not cooperative with exam, moving right upper extremity, not moving lower extremities. Psychiatric: Normal judgment and insight. Alert and oriented x 3. Normal mood.   Labs on Admission: I have personally reviewed following labs and imaging studies  CBC: Recent Labs  Lab 07/30/20 1835  WBC 12.6*  NEUTROABS 10.1*  HGB 7.1*  HCT 21.7*  MCV 97.3  PLT 614   Basic Metabolic Panel: Recent Labs  Lab 07/30/20 1835  NA 130*  K 4.8  CL 99  CO2 19*  GLUCOSE 106*  BUN 89*  CREATININE 3.80*  CALCIUM 8.8*   Liver Function Tests: Recent Labs  Lab 07/30/20 1835  AST 45*  ALT 31  ALKPHOS 88  BILITOT 1.0  PROT 9.1*  ALBUMIN 2.5*   Urine analysis:    Component Value Date/Time   COLORURINE YELLOW  07/30/2020 1944   APPEARANCEUR HAZY (A) 07/30/2020 1944   LABSPEC 1.013 07/30/2020 1944   PHURINE 5.0 07/30/2020 1944   GLUCOSEU NEGATIVE 07/30/2020 1944   HGBUR MODERATE (A) 07/30/2020 1944   BILIRUBINUR NEGATIVE 07/30/2020 1944   KETONESUR NEGATIVE 07/30/2020 1944   PROTEINUR NEGATIVE 07/30/2020 1944   NITRITE NEGATIVE 07/30/2020 1944   LEUKOCYTESUR MODERATE (A) 07/30/2020 1944    Radiological Exams on Admission: DG Chest Port 1 View  Result Date: 07/30/2020 CLINICAL DATA:  Abnormal labs elevated white count low hemoglobin EXAM: PORTABLE CHEST 1 VIEW COMPARISON:  None. FINDINGS: No focal opacity or pleural effusion. Cardiomediastinal silhouette within normal limits. Aortic atherosclerosis. No pneumothorax. IMPRESSION: No active disease. Electronically Signed   By: Donavan Foil M.D.   On: 07/30/2020 20:10    EKG: Independently reviewed.  Rate of 90, QTc 427.  Assessment/Plan Principal Problem:   Acute anemia Active Problems:   Hepatitis C, chronic (HCC)   HIV disease (HCC)   Hepatic cirrhosis (HCC)   Chronic kidney disease   Acute on chronic anemia hemoglobin 7.1,per Care Everywhere discharge hemoglobin was 8.6,  baseline 8-9.  Stool FOBT positive.  No apparent GI blood loss.  Last EGD 2019-Non-bleeding grade I esophageal varices. Moderate Schatzki ring,  MILD Gastritis, MILD Portal hypertensive gastropathy. -Obtain iron studies  -IV Protonix 40 twice daily -Monitor hemoglobin transfuse for hemoglobin less than 8 - 1 U PRBC transfused  Sepsis secondary to urinary tract infection -meets sepsis criteria with tachycardia up to 133 and leukocytosis of 12.6.  Temperature 100.3. Recent hospitalization for prostatitis and UTI.  Cultures grew Enterococcus and E. Coli. -Following sensitivities per Care Everywhere, will continue IV vancomycin and cefepime -Blood and urine cultures  AKI on CKD creatinine 3.8, baseline 1.7-1.8 - 2L Bolus given, continue N/s 75cc/hr x 15hrs. -Hold lisinopril  -HIV -viral load less than 20, CD4 - 239. -HIV meds were held during recent hospitalization due to AKI, will hold for now  Hepatitis C, liver cirrhosis-stable.  Hypertension-  Stable. -Hold lisinopril  Gout- -resume allopurinol  Left-sided weakness-documented at recent hospitalization at [facility].  MRI brain showed multiple microhemorrhages-hypertensive in origin   DVT prophylaxis: Scds Code Status: Full code Family Communication: None at bedside Disposition Plan: ~ 2 days Consults called: None Admission status: Inpt, tele I certify that at the point of admission it is my clinical judgment that the patient will require inpatient hospital care spanning beyond 2 midnights from the point of admission due to high intensity of service, high risk for further deterioration and high frequency of surveillance required.    Bethena Roys MD [facility]  07/30/2020, 12:13 AM

## 2020-07-30 NOTE — Progress Notes (Signed)
Pharmacy Antibiotic Note  Noah Robinson is a 75 y.o. male admitted on 07/30/2020 with unknown source.  Pharmacy has been consulted for cefepime and vancomycin dosing.Baseline SCr is much lower than current result. Will hopefully have some improvement significant enough to require dose adjustments.  Vancomycin 750 mg IV Q 48 hrs. Goal AUC 400-550. Expected AUC: 447 SCr used: 3.8   Plan: Vancomycin 1 g IV x 1, followed by 750 mg every 48 hours. Cefepime 2 g IV every 24 hours. Monitor clinical progress, cultures/sensitivities, renal function, abx plan Vancomycin levels as indicated    Height: 5\' 9"  (175.3 cm) Weight: 62 kg (136 lb 11 oz) IBW/kg (Calculated) : 70.7  Temp (24hrs), Avg:99.2 F (37.3 C), Min:98.1 F (36.7 C), Max:100.3 F (37.9 C)  Recent Labs  Lab 07/30/20 1835  WBC 12.6*  CREATININE 3.80*    Estimated Creatinine Clearance: 15.2 mL/min (A) (by C-G formula based on SCr of 3.8 mg/dL (H)).    Allergies  Allergen Reactions  . Pantoprazole Hives     Thank you for allowing [place] to participate in this patients care. Jens Som, PharmD 07/30/2020 9:11 PM  Please check [email or web address] for unit-specific pharmacy phone numbers.

## 2020-07-30 NOTE — ED Provider Notes (Signed)
[facility] EMERGENCY DEPARTMENT Provider Note   CSN: [id number] Arrival date & time: 07/30/20  1717     History Chief Complaint  Patient presents with  . Abnormal Lab    Noah Robinson is a 75 y.o. male with a past medical history significant for hypertension, stage III CKD, HCV, cirrhosis, HIV (CD4 count 117 on 07/13/2020),, and hyperlipidemia who presents to the ED due to abnormal labs.  Patient sent from [facility] due to hemoglobin of 7.4 with a leukocytosis 11.2.  Patient denies any physical complaints.  Denies melena, hematochezia, and hematemesis.  Patient denies chest pain, shortness of breath, lightheadedness, and dizziness.  Per triage note, patient is currently being treated for a UTI with Levaquin which was started on 5/28.  Chart reviewed.  Patient recently admitted to the hospital on 5/14-5/27 due to UTI/prostatitis.  Patient was also found to be anemic during his hospital stay where he was value by GI with low concern for active bleed.  Patient has a history of varices.  History obtained from patient and past medical records. No interpreter used during encounter.      Past Medical History:  Diagnosis Date  . Chronic kidney disease   . Cirrhosis ([place])   . Colon cancer ([place])    per patient recollection  . Enlarged prostate   . Gout   . HCV (hepatitis C virus)   . Hepatitis B core antibody positive   . HIV (human immunodeficiency virus infection) ([place])    Dx 2019 approx, CD4 nadir 323.   Marland Kitchen Hyperlipidemia   . Hypertension     Patient Active Problem List   Diagnosis Date Noted  . Acute anemia 07/30/2020  . Abnormal gait 05/06/2020  . Iron deficiency anemia 05/06/2020  . Sleep disturbance 05/06/2020  . Itching 04/11/2020  . 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 Thrombocytopenia ([place]) 09/06/2019  . Gout   . HIV (human immunodeficiency virus infection) ([place])   . Hypertension   . Chronic kidney disease   . Hepatitis B core antibody positive   . H.  pylori infection 07/27/2017  . Rectal bleeding 07/27/2017  . HIV disease ([place]) 05/02/2017  . Hepatic cirrhosis ([place]) 05/02/2017  . Anemia 03/03/2017  . Hepatitis C, chronic ([place]) 03/03/2017    Past Surgical History:  Procedure Laterality Date  . BIOPSY  06/28/2017   Procedure: BIOPSY;  Surgeon: Danie Binder, MD;  [facility];  Service: Endoscopy;;  gastric bx's  . COLONOSCOPY     about 10 years ago  . COLONOSCOPY WITH PROPOFOL N/A 06/28/2017   Procedure: COLONOSCOPY WITH PROPOFOL;  Surgeon: Danie Binder, MD;  [facility];  Service: Endoscopy;  Laterality: N/A;  9:15am  . ESOPHAGOGASTRODUODENOSCOPY (EGD) WITH PROPOFOL N/A 06/28/2017   Procedure: ESOPHAGOGASTRODUODENOSCOPY (EGD) WITH PROPOFOL;  Surgeon: Danie Binder, MD;  [facility];  Service: Endoscopy;  Laterality: N/A;  . POLYPECTOMY  06/28/2017   Procedure: POLYPECTOMY;  Surgeon: Danie Binder, MD;  [facility];  Service: Endoscopy;;  transverse colon polyps x2, descending colon polyp       Family History  Problem Relation Age of Onset  . Diabetes Mother   . Healthy Daughter   . Colon cancer Neg Hx     Social History   Tobacco Use  . Smoking status: Never Smoker  . Smokeless tobacco: Never Used  Vaping Use  . Vaping Use: Never used  Substance Use Topics  . Alcohol use:  No    Comment: History of heavy use, remote   . Drug use: Not Currently    Types: Marijuana    Comment: 01/31/18-states he quit all that    Home Medications Prior to Admission medications   Medication Sig Start Date End Date Taking? Authorizing Provider  allopurinol (ZYLOPRIM) 100 MG tablet Take 3 tablets (300 mg total) by mouth daily. 04/10/20   [place] Callas, NP  bictegravir-emtricitabine-tenofovir AF (BIKTARVY) 50-200-25 MG TABS tablet Take 1 tablet by mouth daily. 10/23/19   [place] Callas, NP  colchicine 0.6 MG tablet Take 1 tablet (0.6 mg total) by mouth daily. Take 1 tab daily for  at least 6 more days. If  Symptoms persist past 6 days continue to use until prescription is finished 03/22/20   Cuthriell, Roderic Palau D, PA-C  lisinopril (ZESTRIL) 5 MG tablet Take 5 mg by mouth daily. 09/01/19   [provider]  mirtazapine (REMERON) 7.5 MG tablet Take 7.5 mg by mouth at bedtime. Patient not taking: Reported on 04/10/2020    [provider]  Sofosbuvir-Velpatasvir 400-100 MG TABS TAKE 1 TABLET BY MOUTH DAILY. Patient not taking: No sig reported 11/27/19 11/26/20  Kuppelweiser, Cassie L, RPH-CPP  traMADol (ULTRAM) 50 MG tablet Take 1 tablet by mouth 2 (two) times daily. Patient not taking: Reported on 04/10/2020 07/31/19   [provider]    Allergies    Pantoprazole  Review of Systems   Review of Systems  Constitutional: Negative for chills and fever.  Respiratory: Negative for shortness of breath.   Cardiovascular: Negative for chest pain.  Gastrointestinal: Negative for abdominal pain, anal bleeding, blood in stool, diarrhea, nausea and vomiting.  All other systems reviewed and are negative.   Physical Exam Updated Vital Signs BP 133/67   Pulse (!) 115   Temp 100.3 F (37.9 C) (Rectal)   Resp 18   Ht 5\' 9"  (1.753 m)   Wt 62 kg   SpO2 98%   BMI 20.18 kg/m   Physical Exam Vitals and nursing note reviewed.  Constitutional:      General: He is not in acute distress.    Appearance: He is not ill-appearing.  HENT:     Head: Normocephalic.  Eyes:     Pupils: Pupils are equal, round, and reactive to light.  Cardiovascular:     Rate and Rhythm: Normal rate and regular rhythm.     Pulses: Normal pulses.     Heart sounds: Normal heart sounds. No murmur heard. No friction rub. No gallop.   Pulmonary:     Effort: Pulmonary effort is normal.     Breath sounds: Normal breath sounds.  Abdominal:     General: Abdomen is flat. There is no distension.     Palpations: Abdomen is soft.     Tenderness: There is no abdominal tenderness. There is no  guarding or rebound.  Musculoskeletal:        General: Normal range of motion.     Cervical back: Neck supple.  Skin:    General: Skin is warm and dry.  Neurological:     General: No focal deficit present.     Mental Status: He is alert.  Psychiatric:        Mood and Affect: Mood normal.        Behavior: Behavior normal.     ED Results / Procedures / Treatments   Labs (all labs ordered are listed, but only abnormal results are displayed) Labs Reviewed  CBC WITH  DIFFERENTIAL/PLATELET - Abnormal; Notable for the following components:      Result Value   WBC 12.6 (*)    RBC 2.23 (*)    Hemoglobin 7.1 (*)    HCT 21.7 (*)    Neutro Abs 10.1 (*)    Monocytes Absolute 1.3 (*)    All other components within normal limits  COMPREHENSIVE METABOLIC PANEL - Abnormal; Notable for the following components:   Sodium 130 (*)    CO2 19 (*)    Glucose, Bld 106 (*)    BUN 89 (*)    Creatinine, Ser 3.80 (*)    Calcium 8.8 (*)    Total Protein 9.1 (*)    Albumin 2.5 (*)    AST 45 (*)    GFR, Estimated 16 (*)    All other components within normal limits  URINALYSIS, ROUTINE W REFLEX MICROSCOPIC - Abnormal; Notable for the following components:   APPearance HAZY (*)    Hgb urine dipstick MODERATE (*)    Leukocytes,Ua MODERATE (*)    Bacteria, UA RARE (*)    All other components within normal limits  POC OCCULT BLOOD, ED - Abnormal; Notable for the following components:   Fecal Occult Bld POSITIVE (*)    All other components within normal limits  CULTURE, BLOOD (SINGLE)  URINE CULTURE  RESP PANEL BY RT-PCR (FLU A&B, COVID) ARPGX2  LACTIC ACID, PLASMA  LACTIC ACID, PLASMA  PROTIME-INR  APTT  TYPE AND SCREEN  PREPARE RBC (CROSSMATCH)  ABO/RH    EKG EKG Interpretation  Date/Time:  Wednesday July 30 2020 18:41:02 EDT Ventricular Rate:  119 PR Interval:  147 QRS Duration: 120 QT Interval:  303 QTC Calculation: 427 R Axis:   -45 Text Interpretation: Sinus tachycardia RBBB and  LAFB Abnrm T, consider ischemia, anterolateral lds Baseline wander in lead(s) V2 New since previous tracing Confirmed by Fredia Sorrow [phone]) on 07/30/2020 7:50:16 PM   Radiology DG Chest Port 1 View  Result Date: 07/30/2020 CLINICAL DATA:  Abnormal labs elevated white count low hemoglobin EXAM: PORTABLE CHEST 1 VIEW COMPARISON:  None. FINDINGS: No focal opacity or pleural effusion. Cardiomediastinal silhouette within normal limits. Aortic atherosclerosis. No pneumothorax. IMPRESSION: No active disease. Electronically Signed   By: Donavan Foil M.D.   On: 07/30/2020 20:10    Procedures .Critical Care Performed by: Suzy Bouchard, PA-C Authorized by: Suzy Bouchard, PA-C   Critical care provider statement:    Critical care time (minutes):  36   Critical care was necessary to treat or prevent imminent or life-threatening deterioration of the following conditions:  Sepsis and circulatory failure   Critical care was time spent personally by me on the following activities:  Discussions with consultants, evaluation of patient's response to treatment, examination of patient, ordering and performing treatments and interventions, ordering and review of laboratory studies, ordering and review of radiographic studies, pulse oximetry, re-evaluation of patient's condition, obtaining history from patient or surrogate and review of old charts   I assumed direction of critical care for this patient from another provider in my specialty: no     Care discussed with: admitting provider       Medications Ordered in ED Medications  0.9 %  sodium chloride infusion (has no administration in time range)  lactated ringers infusion (has no administration in time range)  metroNIDAZOLE (FLAGYL) IVPB 500 mg (has no administration in time range)  vancomycin (VANCOCIN) IVPB 1000 mg/200 mL premix (has no administration in time range)  ceFEPIme (MAXIPIME)  2 g in sodium chloride 0.9 % 100 mL IVPB (has no  administration in time range)  vancomycin (VANCOREADY) IVPB 750 mg/150 mL (has no administration in time range)  sodium chloride 0.9 % bolus 1,000 mL (1,000 mLs Intravenous New Bag/Given 07/30/20 2006)  sodium chloride 0.9 % bolus 1,000 mL (1,000 mLs Intravenous New Bag/Given 07/30/20 2005)  ceFEPIme (MAXIPIME) 2 g in sodium chloride 0.9 % 100 mL IVPB (2 g Intravenous New Bag/Given 07/30/20 2101)    ED Course  I have reviewed the triage vital signs and the nursing notes.  Pertinent labs & imaging results that were available during my care of the patient were reviewed by me and considered in my medical decision making (see chart for details).  Clinical Course as of 07/30/20 2135  Wed Jul 30, 2020  1925 Hemoglobin(!): 7.1 [CA]  1928 WBC(!): 12.6 [CA]  1928 Fecal Occult Blood, POC(!): [place](!): MODERATE [CA]  2031 Bacteria, UA(!): RARE [CA]  2031 Hgb urine dipstickMarland Kitchen): MODERATE [CA]    Clinical Course User Index [CA] Karie Kirks   MDM Rules/Calculators/A&P                          75 year old male presents to the ED via EMS from [facility] due to anemia with hemoglobin at 7.4.  Patient is currently being treated for UTI with Levaquin which started on 5/28.  Upon arrival, patient afebrile, bradycardic at 59 with otherwise reassuring vitals however, during patient's ED stay he became tachycardic and borderline febrile at 100.3 F.  Sepsis screening labs ordered.  IV fluids given.  Rectal exam performed with chaperone in room with no hemorrhoids or fissures.  Fecal occult positive.  Hemoglobin 7.1.  discussed risk and benefits of blood transfusion.  Patient agreeable to have blood transfusion.  Type and screen ordered.  1 unit packed red blood cells.  Discussed case with Dr. Rogene Houston who evaluated patient at bedside and agrees with assessment and plan.  CBC with mild leukocytosis at 12.6.  CMP significant for hyponatremia 130.  AKI with creatinine at 3.8  and BUN at 89.  AST at 45.  EKG personally reviewed which demonstrates sinus tachycardia with no signs of acute ischemia.  Chest x-ray personally reviewed which is negative for signs of pneumonia, pneumothorax, or widened mediastinum.  UA significant for moderate hematuria, moderate leukocytes and rare bacteria.  Given patient is currently being treated for acute cystitis with Levaquin will treat with empirical antibiotics for unknown source.  IV antibiotics started. Patient will require admission for possible sepsis and symptomatic anemia.   Discussed case with Dr. Denton Brick with [facility] who agrees to admit patient for further treatment.  Final Clinical Impression(s) / ED Diagnoses Final diagnoses:  Symptomatic anemia  SIRS (systemic inflammatory response syndrome) [facility])    Rx / DC Orders ED Discharge Orders    None       Karie Kirks 07/30/20 2135    Fredia Sorrow, MD 08/08/20 1349

## 2020-07-30 NOTE — ED Triage Notes (Signed)
Pt arrived by [facility] from [facility]. Pt has abnormal labs that were drawn at the nursing home. Pt has elevated wbc and low HGB, HCT. Pt has hx of anemia. Pt also has a UTI that he currently is being treated for with Levaquin that was started on 5/28.   HGB 7.4 HCT 21.7 WBC 11.2

## 2020-07-31 ENCOUNTER — Encounter (HOSPITAL_COMMUNITY): Payer: Self-pay | Admitting: Internal Medicine

## 2020-07-31 DIAGNOSIS — R195 Other fecal abnormalities: Secondary | ICD-10-CM

## 2020-07-31 LAB — CBC
HCT: 24.7 % — ABNORMAL LOW (ref 39.0–52.0)
Hemoglobin: 8.3 g/dL — ABNORMAL LOW (ref 13.0–17.0)
MCH: 31.6 pg (ref 26.0–34.0)
MCHC: 33.6 g/dL (ref 30.0–36.0)
MCV: 93.9 fL (ref 80.0–100.0)
Platelets: 195 10*3/uL (ref 150–400)
RBC: 2.63 MIL/uL — ABNORMAL LOW (ref 4.22–5.81)
RDW: 16.7 % — ABNORMAL HIGH (ref 11.5–15.5)
WBC: 11.4 10*3/uL — ABNORMAL HIGH (ref 4.0–10.5)
nRBC: 0 % (ref 0.0–0.2)

## 2020-07-31 LAB — PROTIME-INR
INR: 1.5 — ABNORMAL HIGH (ref 0.8–1.2)
Prothrombin Time: 17.9 seconds — ABNORMAL HIGH (ref 11.4–15.2)

## 2020-07-31 LAB — FERRITIN: Ferritin: 465 ng/mL — ABNORMAL HIGH (ref 24–336)

## 2020-07-31 LAB — BASIC METABOLIC PANEL
Anion gap: 10 (ref 5–15)
BUN: 85 mg/dL — ABNORMAL HIGH (ref 8–23)
CO2: 16 mmol/L — ABNORMAL LOW (ref 22–32)
Calcium: 8.3 mg/dL — ABNORMAL LOW (ref 8.9–10.3)
Chloride: 106 mmol/L (ref 98–111)
Creatinine, Ser: 3.42 mg/dL — ABNORMAL HIGH (ref 0.61–1.24)
GFR, Estimated: 18 mL/min — ABNORMAL LOW (ref >60)
Glucose, Bld: 104 mg/dL — ABNORMAL HIGH (ref 70–99)
Potassium: 4.1 mmol/L (ref 3.5–5.1)
Sodium: 132 mmol/L — ABNORMAL LOW (ref 135–145)

## 2020-07-31 LAB — HEMOGLOBIN AND HEMATOCRIT, BLOOD
HCT: 25 % — ABNORMAL LOW (ref 39.0–52.0)
Hemoglobin: 8.3 g/dL — ABNORMAL LOW (ref 13.0–17.0)

## 2020-07-31 LAB — IRON AND TIBC
Iron: 48 ug/dL (ref 45–182)
Saturation Ratios: 18 % (ref 17.9–39.5)
TIBC: 271 ug/dL (ref 250–450)
UIBC: 223 ug/dL

## 2020-07-31 LAB — AMMONIA: Ammonia: 10 umol/L (ref 9–35)

## 2020-07-31 MED ORDER — LEVOFLOXACIN IN D5W 250 MG/50ML IV SOLN
250.0000 mg | INTRAVENOUS | Status: DC
  Administered 2020-08-02 – 2020-08-04 (×2): 250 mg via INTRAVENOUS
  Filled 2020-07-31 (×2): qty 50

## 2020-07-31 MED ORDER — ACETAMINOPHEN 325 MG PO TABS
650.0000 mg | ORAL_TABLET | Freq: Four times a day (QID) | ORAL | Status: DC | PRN
  Administered 2020-07-31 – 2020-08-04 (×4): 650 mg via ORAL
  Filled 2020-07-31 (×4): qty 2

## 2020-07-31 MED ORDER — ONDANSETRON HCL 4 MG/2ML IJ SOLN
4.0000 mg | Freq: Four times a day (QID) | INTRAMUSCULAR | Status: DC | PRN
  Administered 2020-08-01: 4 mg via INTRAVENOUS
  Filled 2020-07-31: qty 2

## 2020-07-31 MED ORDER — POLYETHYLENE GLYCOL 3350 17 G PO PACK: 17.0000 g | PACK | Freq: Every day | ORAL | Status: DC | PRN

## 2020-07-31 MED ORDER — ALLOPURINOL 300 MG PO TABS
300.0000 mg | ORAL_TABLET | Freq: Every day | ORAL | Status: DC
  Administered 2020-07-31: 300 mg via ORAL
  Filled 2020-07-31: qty 1

## 2020-07-31 MED ORDER — ALLOPURINOL 100 MG PO TABS
200.0000 mg | ORAL_TABLET | Freq: Every day | ORAL | Status: DC
Start: 2020-08-01 — End: 2020-08-05
  Administered 2020-08-01 – 2020-08-04 (×4): 200 mg via ORAL
  Filled 2020-07-31 (×4): qty 2

## 2020-07-31 MED ORDER — LEVOFLOXACIN IN D5W 500 MG/100ML IV SOLN
250.0000 mg | Freq: Once | INTRAVENOUS | Status: AC
  Administered 2020-07-31: 250 mg via INTRAVENOUS
  Filled 2020-07-31 (×2): qty 100
  Filled 2020-07-31: qty 50

## 2020-07-31 MED ORDER — ONDANSETRON HCL 4 MG PO TABS: 4.0000 mg | ORAL_TABLET | Freq: Four times a day (QID) | ORAL | Status: DC | PRN

## 2020-07-31 MED ORDER — LEVOFLOXACIN IN D5W 250 MG/50ML IV SOLN
250.0000 mg | INTRAVENOUS | Status: DC
  Filled 2020-07-31: qty 50

## 2020-07-31 MED ORDER — SODIUM CHLORIDE 0.9 % IV SOLN: INTRAVENOUS | Status: AC

## 2020-07-31 MED ORDER — ACETAMINOPHEN 650 MG RE SUPP: 650.0000 mg | Freq: Four times a day (QID) | RECTAL | Status: DC | PRN

## 2020-07-31 MED ORDER — FAMOTIDINE IN NACL 20-0.9 MG/50ML-% IV SOLN
20.0000 mg | Freq: Two times a day (BID) | INTRAVENOUS | Status: DC
  Administered 2020-07-31 – 2020-08-01 (×4): 20 mg via INTRAVENOUS
  Filled 2020-07-31 (×5): qty 50

## 2020-07-31 NOTE — Consult Note (Addendum)
Referring Provider: Roxan Hockey, MD Primary Care Physician:  [facility] Primary Gastroenterologist:  Dr. Allen Norris  Reason for Consultation:  Heme positive stool  HPI: Noah Robinson is a 75 y.o. male resident of the [facility], history of HIV with negative RNA May 17, hepatitis C status post treatment with negative HCVRNA May 17, cirrhosis, chronic kidney disease, FTT/short term memory loss presented to the ED for reports of low hemoglobin of 7.4.  Patient currently being treated for UTI with Levaquin which was started May 28.  Upon arrival in the ED he was afebrile, bradycardic at rate of 59 but during his stay he developed tachycardia, temp of 100.3.  On rectal exam he was heme positive.  Noted to have acute on chronic kidney disease, met sepsis criteria for UTI.  Hospitalized at [facility] May 14 through May 27 for acute kidney injury on chronic kidney disease, creatinine up to 8.69 on admission thought to be secondary to post obstruction AKI and ATN.  UTI prostatitis with E. coli and Enterococcus faecalis initially treated with Zosyn, transition to Levaquin with plans to treat for 4 weeks.  Follow-up with urology for failed voiding trial.  History of abnormal MRI brain showing numerous microhemorrhages suggesting hypertensive microhemorrhages.  In the ED: T-max 100.3, tachycardic with rate to 133, respiratory rate 91-66, systolic pressures in the 130s to 140s.  O2 sats greater than 90% on room air.  White blood cell count 12,600, hemoglobin 7.1 (down from 8.6 last week), platelets 232,000, sodium 130, BUN 89, creatinine 3.80, AST 45, ALT 2.5, lactic acid 1.3, SARS coronavirus 2 negative.  Urine suggestive of UTI.  Chest x-ray unremarkable.  ED provider reported heme positive on DRE.  IV vancomycin and cefepime started.  1 unit of packed red blood cells ordered.  Today, ferritin 465, iron 48, TIBC 271, iron saturation is 18%, hemoglobin 8.3 after 1 unit of packed red blood  cells., sodium 132, creatinine 3.42 (3.2 one week ago), BUN 85, white blood cell count 11,400, platelets 195,000.  Right upper quadrant ultrasound Jul 05, 2020: 1.3 cm gallstone, nodular contour of the liver consistent with cirrhosis.  MRI abdomen in September 2021: Small upper abdominal varices identified.  Morphologic features compatible with cirrhosis, no hepatoma, spleen within normal size.  Last EGD April 2019 with 3 columns of nonbleeding grade 1 esophageal varices, moderate Schatzki ring, mild gastritis found to be positive for H. pylori, mild portal hypertensive gastropathy.  Recommended repeat EGD in 3 years.  Confirmed eradication via stool antigen May 2022  Last colonoscopy April 2019 showed Three 3 to 6 mm polyps in the mid descending colon and in the distal transverse colon, removed with a cold snare. Resected and retrieved.  Path showed tubular adenomas.  Diverticulosis in the entire examined colon. NORMOCYTIC ANEMIA MOST LIKELY DUE TO Multiple bleeding colonic angioectasias.Treated with argon plasma coagulation (APC). External hemorrhoids.  Recommended repeat colonoscopy in 3 years.   D/C medication list from 07/12/20 admission  STOP taking these medications  allopurinoL 100 MG tablet Commonly known as: ZYLOPRIM  BIKTARVY 30-120-15 mg Tab Generic drug: bictegrav-emtricit-tenofov ala  colchicine 0.6 mg tablet Commonly known as: COLCRYS  EPCLUSA 150-37.5 mg Plpk Generic drug: sofosbuvir-velpatasvir  lisinopriL 5 MG tablet Commonly known as: PRINIVIL,ZESTRIL  mirtazapine 7.5 MG tablet Commonly known as: REMERON  traMADoL 50 mg tablet Commonly known as: ULTRAM   START taking these medications  aspirin 81 MG chewable tablet Chew 1 tablet (81 mg total) in the morning.  atorvastatin 80 MG tablet Commonly known as: LIPITOR Take 1 tablet (80 mg total) by mouth nightly.  dolutegravir 50 mg TABLET Commonly known as: TIVICAY Take 1 tablet (50 mg total) by mouth in the  morning.  famotidine 20 MG tablet Commonly known as: PEPCID Take 1 tablet (20 mg total) by mouth in the morning.  finasteride 5 mg tablet Commonly known as: PROSCAR Take 1 tablet (5 mg total) by mouth in the morning.  folic acid 1 MG tablet Commonly known as: FOLVITE Take 1 tablet (1 mg total) by mouth in the morning.  lamiVUDine 100 MG tablet Commonly known as: EPIVIR Take 1 tablet (100 mg total) by mouth in the morning.  levoFLOXacin 250 MG tablet Commonly known as: LEVAQUIN Take 1 tablet (250 mg total) by mouth every other day for 16 days. Start taking on: Jul 26, 2020  sevelamer 800 mg tablet Commonly known as: RENVELA Take 1 tablet (800 mg total) by mouth Three (3) times a day with a meal.  sodium bicarbonate 650 mg tablet Take 1 tablet (650 mg total) by mouth Three (3) times a day.  tamsulosin 0.4 mg capsule Commonly known as: FLOMAX Take 1 capsule (0.4 mg total) by mouth nightly.  thiamine mononitrate (vit B1) 100 mg Tab tablet Take 2 tablets (200 mg total) by mouth in the morning.  zinc sulfate 220 mg (50 mg elemental zinc) capsule Commonly known as: ZINCATE Take 1 capsule (220 mg total) by mouth daily.        Prior to Admission medications  INCOMPLETE  Medication Sig Start Date End Date Taking? Authorizing Provider  allopurinol (ZYLOPRIM) 100 MG tablet Take 3 tablets (300 mg total) by mouth daily. 04/10/20   [place] Callas, NP  bictegravir-emtricitabine-tenofovir AF (BIKTARVY) 50-200-25 MG TABS tablet Take 1 tablet by mouth daily. 10/23/19   [place] Callas, NP  colchicine 0.6 MG tablet Take 1 tablet (0.6 mg total) by mouth daily. Take 1 tab daily for at least 6 more days. If  Symptoms persist past 6 days continue to use until prescription is finished 03/22/20   Cuthriell, Roderic Palau D, PA-C  lisinopril (ZESTRIL) 5 MG tablet Take 5 mg by mouth daily. 09/01/19   [provider]  mirtazapine (REMERON) 7.5 MG tablet Take 7.5 mg by mouth at  bedtime. Patient not taking: Reported on 04/10/2020    [provider]  Sofosbuvir-Velpatasvir 400-100 MG TABS TAKE 1 TABLET BY MOUTH DAILY. Patient not taking: No sig reported 11/27/19 11/26/20  Kuppelweiser, Cassie L, RPH-CPP  traMADol (ULTRAM) 50 MG tablet Take 1 tablet by mouth 2 (two) times daily. Patient not taking: Reported on 04/10/2020 07/31/19   [provider]    Current Facility-Administered Medications  Medication Dose Route Frequency Provider Last Rate Last Admin  . 0.9 %  sodium chloride infusion   Intravenous Continuous Emokpae, Ejiroghene E, MD 75 mL/hr at 07/31/20 0015 New Bag at 07/31/20 0015  . acetaminophen (TYLENOL) tablet 650 mg  650 mg Oral Q6H PRN Emokpae, Ejiroghene E, MD       Or  . acetaminophen (TYLENOL) suppository 650 mg  650 mg Rectal Q6H PRN Emokpae, Ejiroghene E, MD      . allopurinol (ZYLOPRIM) tablet 300 mg  300 mg Oral Daily Emokpae, Ejiroghene E, MD      . ceFEPIme (MAXIPIME) 2 g in sodium chloride 0.9 % 100 mL IVPB  2 g Intravenous Q24H Emokpae, Ejiroghene E, MD      . ondansetron (ZOFRAN) tablet 4 mg  4 mg Oral Q6H PRN Emokpae, Ejiroghene E, MD       Or  . ondansetron (ZOFRAN) injection 4 mg  4 mg Intravenous Q6H PRN Emokpae, Ejiroghene E, MD      . polyethylene glycol (MIRALAX / GLYCOLAX) packet 17 g  17 g Oral Daily PRN Emokpae, Ejiroghene E, MD      . Derrill Memo ON 08/01/2020] vancomycin (VANCOREADY) IVPB 750 mg/150 mL  750 mg Intravenous Q48H Emokpae, Ejiroghene E, MD        Allergies as of 07/30/2020 - Review Complete 07/30/2020  Allergen Reaction Noted  . Pantoprazole Hives 09/06/2019    Past Medical History:  Diagnosis Date  . Chronic kidney disease   . Cirrhosis ([place])   . Colon cancer ([place])    per patient recollection  . Enlarged prostate   . Gout   . HCV (hepatitis C virus)   . HIV (human immunodeficiency virus infection) ([place])    Dx 2019 approx, CD4 nadir 323.   Marland Kitchen Hyperlipidemia   . Hypertension     Past Surgical  History:  Procedure Laterality Date  . BIOPSY  06/28/2017   Procedure: BIOPSY;  Surgeon: Danie Binder, MD;  Location: [facility];  Service: Endoscopy;;  gastric bx's  . COLONOSCOPY     about 10 years ago  . COLONOSCOPY WITH PROPOFOL N/A 06/28/2017   Procedure: COLONOSCOPY WITH PROPOFOL;  Surgeon: Danie Binder, MD;  Location: [facility];  Service: Endoscopy;  Laterality: N/A;  9:15am  . ESOPHAGOGASTRODUODENOSCOPY (EGD) WITH PROPOFOL N/A 06/28/2017   Procedure: ESOPHAGOGASTRODUODENOSCOPY (EGD) WITH PROPOFOL;  Surgeon: Danie Binder, MD;  Location: [facility];  Service: Endoscopy;  Laterality: N/A;  . POLYPECTOMY  06/28/2017   Procedure: POLYPECTOMY;  Surgeon: Danie Binder, MD;  Location: [facility];  Service: Endoscopy;;  transverse colon polyps x2, descending colon polyp    Family History  Problem Relation Age of Onset  . Diabetes Mother   . Healthy Daughter   . Colon cancer Neg Hx     Social History   Socioeconomic History  . Marital status: Divorced    Spouse name: Not on file  . Number of children: Not on file  . Years of education: Not on file  . Highest education level: Not on file  Occupational History  . Not on file  Tobacco Use  . Smoking status: Never Smoker  . Smokeless tobacco: Never Used  Vaping Use  . Vaping Use: Never used  Substance and Sexual Activity  . Alcohol use: No    Comment: History of heavy use, remote   . Drug use: Not Currently    Types: Marijuana    Comment: 01/31/18-states he quit all that  . Sexual activity: Not Currently    Birth control/protection: None  Other Topics Concern  . Not on file  Social History Narrative  . Not on file   Social Determinants of Health   Financial Resource Strain: Not on file  Food Insecurity: Not on file  Transportation Needs: Not on file  Physical Activity: Not on file  Stress: Not on file  Social Connections: Not on file  Intimate Partner Violence: Not on file     ROS: DIFFICULT TO  OBTAIN, PATIENT DROWSY, ONLY ANSWERS YES/NO, only complaint was epigastric pain and fatigue  General: Negative for anorexia, weight loss, fever, chills,   Weakness. +fatigue Eyes: Negative for vision changes.  ENT: Negative for hoarseness, difficulty swallowing , nasal congestion. CV: Negative for chest  pain, angina, palpitations, dyspnea on exertion, peripheral edema.  Respiratory: Negative for dyspnea at rest, dyspnea on exertion, cough, sputum, wheezing.  GI: See history of present illness. GU:  Negative for dysuria, hematuria, urinary incontinence, urinary frequency, nocturnal urination.  MS: Negative for joint pain, low back pain.  Derm: Negative for rash or itching.  Neuro: Negative for weakness, abnormal sensation, seizure, frequent headaches, memory loss, confusion.  Psych: Negative for anxiety, depression, suicidal ideation, hallucinations.  Endo: Negative for unusual weight change.  Heme: Negative for bruising or bleeding. Allergy: Negative for rash or hives.       Physical Examination: Vital signs in last 24 hours: Temp:  [97.6 F (36.4 C)-100.3 F (37.9 C)] 97.6 F (36.4 C) (06/02 0652) Pulse Rate:  [59-133] 81 (06/02 0652) Resp:  [15-23] 20 (06/02 0652) BP: (117-147)/(67-87) 117/67 (06/02 0652) SpO2:  [18 %-100 %] 96 % (06/02 0130) Weight:  [61.2 kg-62 kg] 62 kg (06/01 1730) Last BM Date: 07/31/20  General: thin male, sleeping. Arouses with physical stimuli. Thinks he is in [facility]. Answers simple questions yes/no. Does tell me he had an upper endoscopy several years ago. Head: Normocephalic, atraumatic.   Eyes: Conjunctiva pink, no icterus. Mouth: Oropharyngeal mucosa moist and pink , no lesions erythema or exudate. Dentition in poor repair. Neck: Supple without thyromegaly, masses, or lymphadenopathy.  Lungs: Clear to auscultation bilaterally.  Heart: Regular rate and rhythm, no murmurs rubs or gallops.  Abdomen: Bowel sounds are normal,  Mild epigastric  tenderness, nondistended, no hepatosplenomegaly or masses, no abdominal bruits or    hernia , no rebound or guarding.   Rectal: not performed Extremities: No lower extremity edema, clubbing, deformity.  Neuro: Lethargic but arouses. Oriented to self. Could not evaluate for asterixis.   Skin: Warm and dry, no rash or jaundice.   Psych: could not evaluate..        Intake/Output from previous day: 06/01 0701 - 06/02 0700 In: 3453.5 [I.V.:23.5; Blood:630; IV Piggyback:2800] Out: 400 [Urine:400] Intake/Output this shift: No intake/output data recorded.  Lab Results: CBC Recent Labs    07/30/20 1835 07/31/20 0612  WBC 12.6* 11.4*  HGB 7.1* 8.3*  8.3*  HCT 21.7* 24.7*  25.0*  MCV 97.3 93.9  PLT 232 195   BMET Recent Labs    07/30/20 1835 07/31/20 0612  NA 130* 132*  K 4.8 4.1  CL 99 106  CO2 19* 16*  GLUCOSE 106* 104*  BUN 89* 85*  CREATININE 3.80* 3.42*  CALCIUM 8.8* 8.3*   LFT Recent Labs    07/30/20 1835  BILITOT 1.0  ALKPHOS 88  AST 45*  ALT 31  PROT 9.1*  ALBUMIN 2.5*    Lipase No results for input(s): LIPASE in the last 72 hours.  PT/INR No results for input(s): LABPROT, INR in the last 72 hours.    Imaging Studies: DG Chest Port 1 View  Result Date: 07/30/2020 CLINICAL DATA:  Abnormal labs elevated white count low hemoglobin EXAM: PORTABLE CHEST 1 VIEW COMPARISON:  None. FINDINGS: No focal opacity or pleural effusion. Cardiomediastinal silhouette within normal limits. Aortic atherosclerosis. No pneumothorax. IMPRESSION: No active disease. Electronically Signed   By: Donavan Foil M.D.   On: 07/30/2020 20:10   US ABDOMEN LIMITED RUQ (LIVER/GB)  Result Date: 07/05/2020 CLINICAL DATA:  Hepatitis C. EXAM: ULTRASOUND ABDOMEN LIMITED RIGHT UPPER QUADRANT COMPARISON:  October 08, 2019 FINDINGS: Gallbladder: A 1.3 cm stone is seen in a decompressed gallbladder with no wall thickening, pericholecystic fluid, or Murphy's sign.  Common bile duct: Diameter: 2.6 mm  Liver: The liver demonstrates a nodular contour consistent with reported cirrhosis. No mass identified. Portal vein is patent on color Doppler imaging with normal direction of blood flow towards the liver. Other: None. IMPRESSION: 1. The gallbladder is relatively contracted limiting evaluation with a single 1.3 cm stone identified. 2. Nodular contour to the liver consistent with cirrhosis.  No mass. Electronically Signed   By: Dorise Bullion III M.D   On: 07/05/2020 22:21  [4 week]   Impression: 75 year old male with history of HIV, hepatitis C status posttreatment with current RNA/finished Epclusa in January negative, cirrhosis, chronic kidney disease, recurrent UTI presenting to the emergency department from [facility] for drop in hemoglobin, elevated white blood cell count.  GI consulted for heme positive stool.  In the ED urine suggestive of UTI, met sepsis criteria.  Anemia/heme positive stool: No overt bleeding noted.  Heme positive stool on digital rectal exam in the ED.  Appears his baseline has been running in the upper 7-8 range.  Hemoglobin 8.6 on May 27, when checked as an outpatient had noted to drop to 7.  Patient has received 1 unit of packed red blood cells with appropriate increase in hemoglobin.  Last EGD and colonoscopy in 2019.  Noted to have grade 1 esophageal varices, H. pylori gastritis status posttreatment/eradication, portal hypertensive gastropathy, 3 tubular adenomas (due for surveillance at this time) removed from the colon, colonic AVMs status post treatment, hemorrhoids.  Does not appear to have overt GI bleeding.  Possible occult bleeding from AVMs, portal hypertensive gastropathy or colopathy.  Does not appear to be presenting as an esophageal variceal bleed.   Cirrhosis: seen most recently by Dr. Allen Norris, previously followed locally by Dr. Oneida Alar (last seen in 2019). Cirrhosis, likely related to prior EtOH abuse and hepatitis C.  Completed hep C treatment earlier this  year under the direction of ID (January 2022) and appears to be cured.  Last EGD in 2019 with grade 1 esophageal varices.  Due for surveillance EGD at this time.  Will obtain INR along with tomorrow's labs in order to calculate MELD. He is drowsy on exam. Answers some questions appropriately. Tells me about having and upper endoscopy years ago. Unclear what he most recent baseline mentation is, he had been living at home with his daughter earlier this year but now resides in a nursing facility.  MRI brain findings as outlined above.  Plan: 1. CMET CBC am.  2. Pepcid IV bid due to pantoprazole allergy. 3. Currently on IV vanc and cefepime (adequate for SBP prophylaxis) 4. Consider upper endoscopy this admission once medically stable.  5. Ammonia level, PT/INR today.  We would like to thank you for the opportunity to participate in the care of Noah Robinson.  Laureen Ochs. Bernarda Caffey [facility] [phone] 6/2/202211:13 AM     LOS: 1 day

## 2020-07-31 NOTE — Plan of Care (Signed)

## 2020-07-31 NOTE — ED Notes (Signed)
Pt stuck multiple times by lab tech and secondary RN. No blood return noted.

## 2020-07-31 NOTE — H&P (View-Only) (Signed)
Referring Provider: Roxan Hockey, MD Primary Care Physician:  [facility] Primary Gastroenterologist:  Dr. Allen Norris  Reason for Consultation:  Heme positive stool  HPI: Noah Robinson is a 75 y.o. male resident of the [facility], history of HIV with negative RNA May 17, hepatitis C status post treatment with negative HCVRNA May 17, cirrhosis, chronic kidney disease, FTT/short term memory loss presented to the ED for reports of low hemoglobin of 7.4.  Patient currently being treated for UTI with Levaquin which was started May 28.  Upon arrival in the ED he was afebrile, bradycardic at rate of 59 but during his stay he developed tachycardia, temp of 100.3.  On rectal exam he was heme positive.  Noted to have acute on chronic kidney disease, met sepsis criteria for UTI.  Hospitalized at [facility] May 14 through May 27 for acute kidney injury on chronic kidney disease, creatinine up to 8.69 on admission thought to be secondary to post obstruction AKI and ATN.  UTI prostatitis with E. coli and Enterococcus faecalis initially treated with Zosyn, transition to Levaquin with plans to treat for 4 weeks.  Follow-up with urology for failed voiding trial.  History of abnormal MRI brain showing numerous microhemorrhages suggesting hypertensive microhemorrhages.  In the ED: T-max 100.3, tachycardic with rate to 133, respiratory rate 91-66, systolic pressures in the 130s to 140s.  O2 sats greater than 90% on room air.  White blood cell count 12,600, hemoglobin 7.1 (down from 8.6 last week), platelets 232,000, sodium 130, BUN 89, creatinine 3.80, AST 45, ALT 2.5, lactic acid 1.3, SARS coronavirus 2 negative.  Urine suggestive of UTI.  Chest x-ray unremarkable.  ED provider reported heme positive on DRE.  IV vancomycin and cefepime started.  1 unit of packed red blood cells ordered.  Today, ferritin 465, iron 48, TIBC 271, iron saturation is 18%, hemoglobin 8.3 after 1 unit of packed red blood  cells., sodium 132, creatinine 3.42 (3.2 one week ago), BUN 85, white blood cell count 11,400, platelets 195,000.  Right upper quadrant ultrasound Jul 05, 2020: 1.3 cm gallstone, nodular contour of the liver consistent with cirrhosis.  MRI abdomen in September 2021: Small upper abdominal varices identified.  Morphologic features compatible with cirrhosis, no hepatoma, spleen within normal size.  Last EGD April 2019 with 3 columns of nonbleeding grade 1 esophageal varices, moderate Schatzki ring, mild gastritis found to be positive for H. pylori, mild portal hypertensive gastropathy.  Recommended repeat EGD in 3 years.  Confirmed eradication via stool antigen May 2022  Last colonoscopy April 2019 showed Three 3 to 6 mm polyps in the mid descending colon and in the distal transverse colon, removed with a cold snare. Resected and retrieved.  Path showed tubular adenomas.  Diverticulosis in the entire examined colon. NORMOCYTIC ANEMIA MOST LIKELY DUE TO Multiple bleeding colonic angioectasias.Treated with argon plasma coagulation (APC). External hemorrhoids.  Recommended repeat colonoscopy in 3 years.   D/C medication list from 07/12/20 admission  STOP taking these medications  allopurinoL 100 MG tablet Commonly known as: ZYLOPRIM  BIKTARVY 30-120-15 mg Tab Generic drug: bictegrav-emtricit-tenofov ala  colchicine 0.6 mg tablet Commonly known as: COLCRYS  EPCLUSA 150-37.5 mg Plpk Generic drug: sofosbuvir-velpatasvir  lisinopriL 5 MG tablet Commonly known as: PRINIVIL,ZESTRIL  mirtazapine 7.5 MG tablet Commonly known as: REMERON  traMADoL 50 mg tablet Commonly known as: ULTRAM   START taking these medications  aspirin 81 MG chewable tablet Chew 1 tablet (81 mg total) in the morning.  atorvastatin 80 MG tablet Commonly known as: LIPITOR Take 1 tablet (80 mg total) by mouth nightly.  dolutegravir 50 mg TABLET Commonly known as: TIVICAY Take 1 tablet (50 mg total) by mouth in the  morning.  famotidine 20 MG tablet Commonly known as: PEPCID Take 1 tablet (20 mg total) by mouth in the morning.  finasteride 5 mg tablet Commonly known as: PROSCAR Take 1 tablet (5 mg total) by mouth in the morning.  folic acid 1 MG tablet Commonly known as: FOLVITE Take 1 tablet (1 mg total) by mouth in the morning.  lamiVUDine 100 MG tablet Commonly known as: EPIVIR Take 1 tablet (100 mg total) by mouth in the morning.  levoFLOXacin 250 MG tablet Commonly known as: LEVAQUIN Take 1 tablet (250 mg total) by mouth every other day for 16 days. Start taking on: Jul 26, 2020  sevelamer 800 mg tablet Commonly known as: RENVELA Take 1 tablet (800 mg total) by mouth Three (3) times a day with a meal.  sodium bicarbonate 650 mg tablet Take 1 tablet (650 mg total) by mouth Three (3) times a day.  tamsulosin 0.4 mg capsule Commonly known as: FLOMAX Take 1 capsule (0.4 mg total) by mouth nightly.  thiamine mononitrate (vit B1) 100 mg Tab tablet Take 2 tablets (200 mg total) by mouth in the morning.  zinc sulfate 220 mg (50 mg elemental zinc) capsule Commonly known as: ZINCATE Take 1 capsule (220 mg total) by mouth daily.        Prior to Admission medications  INCOMPLETE  Medication Sig Start Date End Date Taking? Authorizing Provider  allopurinol (ZYLOPRIM) 100 MG tablet Take 3 tablets (300 mg total) by mouth daily. 04/10/20   [place] Callas, NP  bictegravir-emtricitabine-tenofovir AF (BIKTARVY) 50-200-25 MG TABS tablet Take 1 tablet by mouth daily. 10/23/19   [place] Callas, NP  colchicine 0.6 MG tablet Take 1 tablet (0.6 mg total) by mouth daily. Take 1 tab daily for at least 6 more days. If  Symptoms persist past 6 days continue to use until prescription is finished 03/22/20   Cuthriell, Roderic Palau D, PA-C  lisinopril (ZESTRIL) 5 MG tablet Take 5 mg by mouth daily. 09/01/19   [provider]  mirtazapine (REMERON) 7.5 MG tablet Take 7.5 mg by mouth at  bedtime. Patient not taking: Reported on 04/10/2020    [provider]  Sofosbuvir-Velpatasvir 400-100 MG TABS TAKE 1 TABLET BY MOUTH DAILY. Patient not taking: No sig reported 11/27/19 11/26/20  Kuppelweiser, Cassie L, RPH-CPP  traMADol (ULTRAM) 50 MG tablet Take 1 tablet by mouth 2 (two) times daily. Patient not taking: Reported on 04/10/2020 07/31/19   [provider]    Current Facility-Administered Medications  Medication Dose Route Frequency Provider Last Rate Last Admin  . 0.9 %  sodium chloride infusion   Intravenous Continuous Emokpae, Ejiroghene E, MD 75 mL/hr at 07/31/20 0015 New Bag at 07/31/20 0015  . acetaminophen (TYLENOL) tablet 650 mg  650 mg Oral Q6H PRN Emokpae, Ejiroghene E, MD       Or  . acetaminophen (TYLENOL) suppository 650 mg  650 mg Rectal Q6H PRN Emokpae, Ejiroghene E, MD      . allopurinol (ZYLOPRIM) tablet 300 mg  300 mg Oral Daily Emokpae, Ejiroghene E, MD      . ceFEPIme (MAXIPIME) 2 g in sodium chloride 0.9 % 100 mL IVPB  2 g Intravenous Q24H Emokpae, Ejiroghene E, MD      . ondansetron (ZOFRAN) tablet 4 mg  4 mg Oral Q6H PRN Emokpae, Ejiroghene E, MD       Or  . ondansetron (ZOFRAN) injection 4 mg  4 mg Intravenous Q6H PRN Emokpae, Ejiroghene E, MD      . polyethylene glycol (MIRALAX / GLYCOLAX) packet 17 g  17 g Oral Daily PRN Emokpae, Ejiroghene E, MD      . Derrill Memo ON 08/01/2020] vancomycin (VANCOREADY) IVPB 750 mg/150 mL  750 mg Intravenous Q48H Emokpae, Ejiroghene E, MD        Allergies as of 07/30/2020 - Review Complete 07/30/2020  Allergen Reaction Noted  . Pantoprazole Hives 09/06/2019    Past Medical History:  Diagnosis Date  . Chronic kidney disease   . Cirrhosis ([place])   . Colon cancer ([place])    per patient recollection  . Enlarged prostate   . Gout   . HCV (hepatitis C virus)   . HIV (human immunodeficiency virus infection) ([place])    Dx 2019 approx, CD4 nadir 323.   Marland Kitchen Hyperlipidemia   . Hypertension     Past Surgical  History:  Procedure Laterality Date  . BIOPSY  06/28/2017   Procedure: BIOPSY;  Surgeon: Danie Binder, MD;  Location: [facility];  Service: Endoscopy;;  gastric bx's  . COLONOSCOPY     about 10 years ago  . COLONOSCOPY WITH PROPOFOL N/A 06/28/2017   Procedure: COLONOSCOPY WITH PROPOFOL;  Surgeon: Danie Binder, MD;  Location: [facility];  Service: Endoscopy;  Laterality: N/A;  9:15am  . ESOPHAGOGASTRODUODENOSCOPY (EGD) WITH PROPOFOL N/A 06/28/2017   Procedure: ESOPHAGOGASTRODUODENOSCOPY (EGD) WITH PROPOFOL;  Surgeon: Danie Binder, MD;  Location: [facility];  Service: Endoscopy;  Laterality: N/A;  . POLYPECTOMY  06/28/2017   Procedure: POLYPECTOMY;  Surgeon: Danie Binder, MD;  Location: [facility];  Service: Endoscopy;;  transverse colon polyps x2, descending colon polyp    Family History  Problem Relation Age of Onset  . Diabetes Mother   . Healthy Daughter   . Colon cancer Neg Hx     Social History   Socioeconomic History  . Marital status: Divorced    Spouse name: Not on file  . Number of children: Not on file  . Years of education: Not on file  . Highest education level: Not on file  Occupational History  . Not on file  Tobacco Use  . Smoking status: Never Smoker  . Smokeless tobacco: Never Used  Vaping Use  . Vaping Use: Never used  Substance and Sexual Activity  . Alcohol use: No    Comment: History of heavy use, remote   . Drug use: Not Currently    Types: Marijuana    Comment: 01/31/18-states he quit all that  . Sexual activity: Not Currently    Birth control/protection: None  Other Topics Concern  . Not on file  Social History Narrative  . Not on file   Social Determinants of Health   Financial Resource Strain: Not on file  Food Insecurity: Not on file  Transportation Needs: Not on file  Physical Activity: Not on file  Stress: Not on file  Social Connections: Not on file  Intimate Partner Violence: Not on file     ROS: DIFFICULT TO  OBTAIN, PATIENT DROWSY, ONLY ANSWERS YES/NO, only complaint was epigastric pain and fatigue  General: Negative for anorexia, weight loss, fever, chills,   Weakness. +fatigue Eyes: Negative for vision changes.  ENT: Negative for hoarseness, difficulty swallowing , nasal congestion. CV: Negative for chest  pain, angina, palpitations, dyspnea on exertion, peripheral edema.  Respiratory: Negative for dyspnea at rest, dyspnea on exertion, cough, sputum, wheezing.  GI: See history of present illness. GU:  Negative for dysuria, hematuria, urinary incontinence, urinary frequency, nocturnal urination.  MS: Negative for joint pain, low back pain.  Derm: Negative for rash or itching.  Neuro: Negative for weakness, abnormal sensation, seizure, frequent headaches, memory loss, confusion.  Psych: Negative for anxiety, depression, suicidal ideation, hallucinations.  Endo: Negative for unusual weight change.  Heme: Negative for bruising or bleeding. Allergy: Negative for rash or hives.       Physical Examination: Vital signs in last 24 hours: Temp:  [97.6 F (36.4 C)-100.3 F (37.9 C)] 97.6 F (36.4 C) (06/02 0652) Pulse Rate:  [59-133] 81 (06/02 0652) Resp:  [15-23] 20 (06/02 0652) BP: (117-147)/(67-87) 117/67 (06/02 0652) SpO2:  [18 %-100 %] 96 % (06/02 0130) Weight:  [61.2 kg-62 kg] 62 kg (06/01 1730) Last BM Date: 07/31/20  General: thin male, sleeping. Arouses with physical stimuli. Thinks he is in [facility]. Answers simple questions yes/no. Does tell me he had an upper endoscopy several years ago. Head: Normocephalic, atraumatic.   Eyes: Conjunctiva pink, no icterus. Mouth: Oropharyngeal mucosa moist and pink , no lesions erythema or exudate. Dentition in poor repair. Neck: Supple without thyromegaly, masses, or lymphadenopathy.  Lungs: Clear to auscultation bilaterally.  Heart: Regular rate and rhythm, no murmurs rubs or gallops.  Abdomen: Bowel sounds are normal,  Mild epigastric  tenderness, nondistended, no hepatosplenomegaly or masses, no abdominal bruits or    hernia , no rebound or guarding.   Rectal: not performed Extremities: No lower extremity edema, clubbing, deformity.  Neuro: Lethargic but arouses. Oriented to self. Could not evaluate for asterixis.   Skin: Warm and dry, no rash or jaundice.   Psych: could not evaluate..        Intake/Output from previous day: 06/01 0701 - 06/02 0700 In: 3453.5 [I.V.:23.5; Blood:630; IV Piggyback:2800] Out: 400 [Urine:400] Intake/Output this shift: No intake/output data recorded.  Lab Results: CBC Recent Labs    07/30/20 1835 07/31/20 0612  WBC 12.6* 11.4*  HGB 7.1* 8.3*  8.3*  HCT 21.7* 24.7*  25.0*  MCV 97.3 93.9  PLT 232 195   BMET Recent Labs    07/30/20 1835 07/31/20 0612  NA 130* 132*  K 4.8 4.1  CL 99 106  CO2 19* 16*  GLUCOSE 106* 104*  BUN 89* 85*  CREATININE 3.80* 3.42*  CALCIUM 8.8* 8.3*   LFT Recent Labs    07/30/20 1835  BILITOT 1.0  ALKPHOS 88  AST 45*  ALT 31  PROT 9.1*  ALBUMIN 2.5*    Lipase No results for input(s): LIPASE in the last 72 hours.  PT/INR No results for input(s): LABPROT, INR in the last 72 hours.    Imaging Studies: DG Chest Port 1 View  Result Date: 07/30/2020 CLINICAL DATA:  Abnormal labs elevated white count low hemoglobin EXAM: PORTABLE CHEST 1 VIEW COMPARISON:  None. FINDINGS: No focal opacity or pleural effusion. Cardiomediastinal silhouette within normal limits. Aortic atherosclerosis. No pneumothorax. IMPRESSION: No active disease. Electronically Signed   By: Donavan Foil M.D.   On: 07/30/2020 20:10   US ABDOMEN LIMITED RUQ (LIVER/GB)  Result Date: 07/05/2020 CLINICAL DATA:  Hepatitis C. EXAM: ULTRASOUND ABDOMEN LIMITED RIGHT UPPER QUADRANT COMPARISON:  October 08, 2019 FINDINGS: Gallbladder: A 1.3 cm stone is seen in a decompressed gallbladder with no wall thickening, pericholecystic fluid, or Murphy's sign.  Common bile duct: Diameter: 2.6 mm  Liver: The liver demonstrates a nodular contour consistent with reported cirrhosis. No mass identified. Portal vein is patent on color Doppler imaging with normal direction of blood flow towards the liver. Other: None. IMPRESSION: 1. The gallbladder is relatively contracted limiting evaluation with a single 1.3 cm stone identified. 2. Nodular contour to the liver consistent with cirrhosis.  No mass. Electronically Signed   By: Dorise Bullion III M.D   On: 07/05/2020 22:21  [4 week]   Impression: 75 year old male with history of HIV, hepatitis C status posttreatment with current RNA/finished Epclusa in January negative, cirrhosis, chronic kidney disease, recurrent UTI presenting to the emergency department from [facility] for drop in hemoglobin, elevated white blood cell count.  GI consulted for heme positive stool.  In the ED urine suggestive of UTI, met sepsis criteria.  Anemia/heme positive stool: No overt bleeding noted.  Heme positive stool on digital rectal exam in the ED.  Appears his baseline has been running in the upper 7-8 range.  Hemoglobin 8.6 on May 27, when checked as an outpatient had noted to drop to 7.  Patient has received 1 unit of packed red blood cells with appropriate increase in hemoglobin.  Last EGD and colonoscopy in 2019.  Noted to have grade 1 esophageal varices, H. pylori gastritis status posttreatment/eradication, portal hypertensive gastropathy, 3 tubular adenomas (due for surveillance at this time) removed from the colon, colonic AVMs status post treatment, hemorrhoids.  Does not appear to have overt GI bleeding.  Possible occult bleeding from AVMs, portal hypertensive gastropathy or colopathy.  Does not appear to be presenting as an esophageal variceal bleed.   Cirrhosis: seen most recently by Dr. Allen Norris, previously followed locally by Dr. Oneida Alar (last seen in 2019). Cirrhosis, likely related to prior EtOH abuse and hepatitis C.  Completed hep C treatment earlier this  year under the direction of ID (January 2022) and appears to be cured.  Last EGD in 2019 with grade 1 esophageal varices.  Due for surveillance EGD at this time.  Will obtain INR along with tomorrow's labs in order to calculate MELD. He is drowsy on exam. Answers some questions appropriately. Tells me about having and upper endoscopy years ago. Unclear what he most recent baseline mentation is, he had been living at home with his daughter earlier this year but now resides in a nursing facility.  MRI brain findings as outlined above.  Plan: 1. CMET CBC am.  2. Pepcid IV bid due to pantoprazole allergy. 3. Currently on IV vanc and cefepime (adequate for SBP prophylaxis) 4. Consider upper endoscopy this admission once medically stable.  5. Ammonia level, PT/INR today.  We would like to thank you for the opportunity to participate in the care of Noah Robinson.  Laureen Ochs. Bernarda Caffey [facility] [phone] 6/2/202211:13 AM     LOS: 1 day

## 2020-07-31 NOTE — Progress Notes (Signed)
Patient Demographics:    Noah Robinson, is a 75 y.o. male, DOB - 12-Feb-1946, ZJQ:[id number]  Admit date - 07/30/2020   Admitting Physician Ejiroghene Arlyce Dice, MD  Outpatient Primary MD for the patient is [facility]  LOS - 1   Chief Complaint  Patient presents with  . Abnormal Lab        Subjective:    Noah Robinson today has no fevers, no emesis,  No chest pain,  abd pain persist   Assessment  & Plan :    Principal Problem:   Acute anemia Active Problems:   Hepatitis C, chronic (HCC)   HIV disease (HCC)   Hepatic cirrhosis (HCC)   Chronic kidney disease  Brief Summary:- 75 y.o. male resident of the [facility], history of HIV with negative RNA (06/2020), hepatitis C status post treatment with negative HCVRNA (06/2020) cirrhosis, chronic kidney disease 3A, FTT/short term memory loss admitted on 07/30/20 with drop in hgb and Heme+ve stool  A/p 1) acute on chronic anemia due to ABLA--GI consult appreciated hgb is up to 8.3 from 7.1 after transfusion of 1 unit of PRBC -IV Pepcid as patient has PPI allergy -Defer to GI service regarding timing of possible EGD -Iron studies WNL -Patient had EGD and colonoscopy back in April 2019  2) E. coli and Enterococcus UTI/prostatitis----from lab work at [facility] was to treat patient for 4 weeks with Levaquin starting 07/26/2020 we will continue this -WBC down to 11.4 from 12.6  3)Liver cirrhosis--- Levaquin as above in #2 we will also cover for SBP prophylaxis -Ammonia is not elevated  4) hepatitis B/Hep C/HIV--- treated, HIV RNA and HCV RNA negative/not detectable on 07/15/2020  5) sepsis secondary to #2 above-- POA--- patient met sepsis criteria on admission -Continue Levaquin as above #2  6) prior stroke with residual left-sided hemiparesis--- previous MRI with multiple microhemorrhages -Suspect HTN  related  7)HTN-hold lisinopril,   may use IV labetalol when necessary  Every 4 hours for systolic blood pressure over 160 mmhg  8)GOut--no acute flareup at this time --continue to hold allopurinol  Disposition/Need for in-Hospital Stay- patient unable to be discharged at this time due to --sepsis secondary to UTI/prostatitis requiring antibiotics and IV fluids, possible GI bleed requiring endoluminal evaluation*  Status is: Inpatient  Remains inpatient appropriate because:Please see disposition above   Disposition: The patient is from: SNF              Anticipated d/c is to: SNF              Anticipated d/c date is: 2 days              Patient currently is not medically stable to d/c. Barriers: Not Clinically Stable-   Code Status :  -  Code Status: Full Code   Family Communication:   (patient is alert, awake and coherent)   Consults  :  Gi  DVT Prophylaxis  :   - SCDs  SCDs Start: 07/31/20 0014    Lab Results  Component Value Date   PLT 195 07/31/2020    Inpatient Medications  Scheduled Meds: . [START ON 08/01/2020] allopurinol  200 mg Oral Daily   Continuous Infusions: . famotidine (  PEPCID) IV 20 mg (07/31/20 1339)  . [START ON 08/02/2020] levofloxacin (LEVAQUIN) IV    . levofloxacin (LEVAQUIN) IV     PRN Meds:.acetaminophen **OR** acetaminophen, ondansetron **OR** ondansetron (ZOFRAN) IV, polyethylene glycol    Anti-infectives (From admission, onward)   Start     Dose/Rate Route Frequency Ordered Stop   08/02/20 1800  Levofloxacin (LEVAQUIN) IVPB 250 mg        250 mg 50 mL/hr over 60 Minutes Intravenous Every 48 hours 07/31/20 1624     08/01/20 2200  vancomycin (VANCOREADY) IVPB 750 mg/150 mL  Status:  Discontinued        750 mg 150 mL/hr over 60 Minutes Intravenous Every 48 hours 07/30/20 2113 07/31/20 1609   07/31/20 2200  ceFEPIme (MAXIPIME) 2 g in sodium chloride 0.9 % 100 mL IVPB  Status:  Discontinued        2 g 200 mL/hr over 30 Minutes Intravenous  Every 24 hours 07/30/20 2100 07/31/20 1609   07/31/20 1800  Levofloxacin (LEVAQUIN) IVPB 250 mg  Status:  Discontinued        250 mg 50 mL/hr over 60 Minutes Intravenous Every 48 hours 07/31/20 1612 07/31/20 1624   07/31/20 1800  Levofloxacin (LEVAQUIN) IVPB 250 mg        250 mg 50 mL/hr over 60 Minutes Intravenous  Once 07/31/20 1625     07/30/20 2100  ceFEPIme (MAXIPIME) 2 g in sodium chloride 0.9 % 100 mL IVPB        2 g 200 mL/hr over 30 Minutes Intravenous  Once 07/30/20 2049 07/30/20 2147   07/30/20 2100  metroNIDAZOLE (FLAGYL) IVPB 500 mg        500 mg 100 mL/hr over 60 Minutes Intravenous  Once 07/30/20 2049 07/30/20 2304   07/30/20 2100  vancomycin (VANCOCIN) IVPB 1000 mg/200 mL premix        1,000 mg 200 mL/hr over 60 Minutes Intravenous  Once 07/30/20 2049 07/31/20 0005        Objective:   Vitals:   07/31/20 0100 07/31/20 0130 07/31/20 0652 07/31/20 1407  BP: (!) 147/83 (!) 141/87 117/67 135/69  Pulse: (!) 104 (!) 105 81 98  Resp: _0 Temp:   97.6 F (36.4 C) (!) 100.8 F (38.2 C)  TempSrc:    Oral  SpO2: 100% 96%  100%  Weight:      Height:        Wt Readings from Last 3 Encounters:  07/30/20 62 kg  05/06/20 72.7 kg  04/10/20 67.6 kg     Intake/Output Summary (Last 24 hours) at 07/31/2020 1757 Last data filed at 07/31/2020 1300 Gross per 24 hour  Intake 4053.5 ml  Output 1000 ml  Net 3053.5 ml   Physical Exam  Gen:- Awake Alert,  In no apparent distress  HEENT:- [place].AT, No sclera icterus Neck-Supple Neck,No JVD,.  Lungs-  CTAB , fair symmetrical air movement CV- S1, S2 normal, regular  Abd-  +ve B.Sounds, Abd Soft, mild epigastric and periumbilical area tenderness without rebound or guarding Extremity/Skin:- No  edema, pedal pulses present  Psych-affect is appropriate, oriented x3 Neuro-residual left-sided hemiparesis, no new focal deficits, no tremors   Data Review:   Micro Results Recent Results (from the past 240 hour(s))  Blood  culture (routine single)     Status: None (Preliminary result)   Collection Time: 07/30/20  8:24 PM   Specimen: BLOOD RIGHT HAND  Result Value Ref Range Status   Specimen Description  BLOOD RIGHT HAND  Final   Special Requests   Final    BOTTLES DRAWN AEROBIC AND ANAEROBIC Blood Culture adequate volume   Culture   Final    NO GROWTH < 12 HOURS Performed at [facility], [address], [place], [place] [address]    Report Status PENDING  Incomplete  Resp Panel by RT-PCR (Flu A&B, Covid) Nasopharyngeal Swab     Status: None   Collection Time: 07/30/20  9:50 PM   Specimen: Nasopharyngeal Swab; Nasopharyngeal(NP) swabs in vial transport medium  Result Value Ref Range Status   SARS Coronavirus 2 by RT PCR NEGATIVE NEGATIVE Final    Comment: (NOTE) SARS-CoV-2 target nucleic acids are NOT DETECTED.  The SARS-CoV-2 RNA is generally detectable in upper respiratory specimens during the acute phase of infection. The lowest concentration of SARS-CoV-2 viral copies this assay can detect is 138 copies/mL. A negative result does not preclude SARS-Cov-2 infection and should not be used as the sole basis for treatment or other patient management decisions. A negative result may occur with  improper specimen collection/handling, submission of specimen other than nasopharyngeal swab, presence of viral mutation(s) within the areas targeted by this assay, and inadequate number of viral copies(<138 copies/mL). A negative result must be combined with clinical observations, patient history, and epidemiological information. The expected result is Negative.  Fact Sheet for Patients:  EntrepreneurPulse.com.au  Fact Sheet for Healthcare Providers:  IncredibleEmployment.be  This test is no t yet approved or cleared by the [place] FDA and  has been authorized for detection and/or diagnosis of SARS-CoV-2 by FDA under an Emergency Use Authorization (EUA). This EUA  will remain  in effect (meaning this test can be used) for the duration of the COVID-19 declaration under Section 564(b)(1) of the Act, 21 U.S.C.section 360bbb-3(b)(1), unless the authorization is terminated  or revoked sooner.       Influenza A by PCR NEGATIVE NEGATIVE Final   Influenza B by PCR NEGATIVE NEGATIVE Final    Comment: (NOTE) The Xpert Xpress SARS-CoV-2/FLU/RSV plus assay is intended as an aid in the diagnosis of influenza from Nasopharyngeal swab specimens and should not be used as a sole basis for treatment. Nasal washings and aspirates are unacceptable for Xpert Xpress SARS-CoV-2/FLU/RSV testing.  Fact Sheet for Patients: EntrepreneurPulse.com.au  Fact Sheet for Healthcare Providers: IncredibleEmployment.be  This test is not yet approved or cleared by the [place] FDA and has been authorized for detection and/or diagnosis of SARS-CoV-2 by FDA under an Emergency Use Authorization (EUA). This EUA will remain in effect (meaning this test can be used) for the duration of the COVID-19 declaration under Section 564(b)(1) of the Act, 21 U.S.C. section 360bbb-3(b)(1), unless the authorization is terminated or revoked.  Performed at [facility], [address], [place], [place] [address]     Radiology Reports DG Chest [place] 1 View  Result Date: 07/30/2020 CLINICAL DATA:  Abnormal labs elevated white count low hemoglobin EXAM: PORTABLE CHEST 1 VIEW COMPARISON:  None. FINDINGS: No focal opacity or pleural effusion. Cardiomediastinal silhouette within normal limits. Aortic atherosclerosis. No pneumothorax. IMPRESSION: No active disease. Electronically Signed   By: Donavan Foil M.D.   On: 07/30/2020 20:10   US ABDOMEN LIMITED RUQ (LIVER/GB)  Result Date: 07/05/2020 CLINICAL DATA:  Hepatitis C. EXAM: ULTRASOUND ABDOMEN LIMITED RIGHT UPPER QUADRANT COMPARISON:  October 08, 2019 FINDINGS: Gallbladder: A 1.3 cm stone is seen in a  decompressed gallbladder with no wall thickening, pericholecystic fluid, or Murphy's sign. Common bile duct:  Diameter: 2.6 mm Liver: The liver demonstrates a nodular contour consistent with reported cirrhosis. No mass identified. Portal vein is patent on color Doppler imaging with normal direction of blood flow towards the liver. Other: None. IMPRESSION: 1. The gallbladder is relatively contracted limiting evaluation with a single 1.3 cm stone identified. 2. Nodular contour to the liver consistent with cirrhosis.  No mass. Electronically Signed   By: Dorise Bullion III M.D   On: 07/05/2020 22:21     CBC Recent Labs  Lab 07/30/20 1835 07/31/20 0612  WBC 12.6* 11.4*  HGB 7.1* 8.3*  8.3*  HCT 21.7* 24.7*  25.0*  PLT 232 195  MCV 97.3 93.9  MCH 31.8 31.6  MCHC 32.7 33.6  RDW 15.3 16.7*  LYMPHSABS 1.1  --   MONOABS 1.3*  --   EOSABS 0.0  --   BASOSABS 0.0  --     Chemistries  Recent Labs  Lab 07/30/20 1835 07/31/20 0612  NA 130* 132*  K 4.8 4.1  CL 99 106  CO2 19* 16*  GLUCOSE 106* 104*  BUN 89* 85*  CREATININE 3.80* 3.42*  CALCIUM 8.8* 8.3*  AST 45*  --   ALT 31  --   ALKPHOS 88  --   BILITOT 1.0  --    ------------------------------------------------------------------------------------------------------------------ No results for input(s): CHOL, HDL, LDLCALC, TRIG, CHOLHDL, LDLDIRECT in the last 72 hours.  No results found for: HGBA1C ------------------------------------------------------------------------------------------------------------------ No results for input(s): TSH, T4TOTAL, T3FREE, THYROIDAB in the last 72 hours.  Invalid input(s): FREET3 ------------------------------------------------------------------------------------------------------------------ Recent Labs    07/31/20 0210  FERRITIN 465*  TIBC 271  IRON 48    Coagulation profile Recent Labs  Lab 07/31/20 1144  INR 1.5*    No results for input(s): DDIMER in the last 72  hours.  Cardiac Enzymes No results for input(s): CKMB, TROPONINI, MYOGLOBIN in the last 168 hours.  Invalid input(s): CK ------------------------------------------------------------------------------------------------------------------ No results found for: BNP   Roxan Hockey M.D on 07/31/2020 at 5:57 PM  Go to [email or web address] - for contact info  [facility] - Office  [phone]

## 2020-08-01 ENCOUNTER — Inpatient Hospital Stay (HOSPITAL_COMMUNITY): Payer: Medicare Other | Admitting: Certified Registered"

## 2020-08-01 ENCOUNTER — Encounter (HOSPITAL_COMMUNITY)
Admission: EM | Disposition: A | Discharge status: Skilled Nursing Facility | Payer: Self-pay | Source: Skilled Nursing Facility | Attending: Family Medicine

## 2020-08-01 ENCOUNTER — Encounter (HOSPITAL_COMMUNITY): Payer: Self-pay | Admitting: Internal Medicine

## 2020-08-01 HISTORY — PX: ESOPHAGOGASTRODUODENOSCOPY (EGD) WITH PROPOFOL: SHX5813

## 2020-08-01 LAB — COMPREHENSIVE METABOLIC PANEL
ALT: 26 U/L (ref 0–44)
AST: 41 U/L (ref 15–41)
Albumin: 2 g/dL — ABNORMAL LOW (ref 3.5–5.0)
Alkaline Phosphatase: 79 U/L (ref 38–126)
Anion gap: 8 (ref 5–15)
BUN: 71 mg/dL — ABNORMAL HIGH (ref 8–23)
CO2: 16 mmol/L — ABNORMAL LOW (ref 22–32)
Calcium: 8.3 mg/dL — ABNORMAL LOW (ref 8.9–10.3)
Chloride: 108 mmol/L (ref 98–111)
Creatinine, Ser: 2.84 mg/dL — ABNORMAL HIGH (ref 0.61–1.24)
GFR, Estimated: 23 mL/min — ABNORMAL LOW (ref >60)
Glucose, Bld: 101 mg/dL — ABNORMAL HIGH (ref 70–99)
Potassium: 3.8 mmol/L (ref 3.5–5.1)
Sodium: 132 mmol/L — ABNORMAL LOW (ref 135–145)
Total Bilirubin: 1.1 mg/dL (ref 0.3–1.2)
Total Protein: 7.3 g/dL (ref 6.5–8.1)

## 2020-08-01 LAB — CBC
HCT: 22.7 % — ABNORMAL LOW (ref 39.0–52.0)
Hemoglobin: 7.3 g/dL — ABNORMAL LOW (ref 13.0–17.0)
MCH: 31.3 pg (ref 26.0–34.0)
MCHC: 32.2 g/dL (ref 30.0–36.0)
MCV: 97.4 fL (ref 80.0–100.0)
Platelets: 190 10*3/uL (ref 150–400)
RBC: 2.33 MIL/uL — ABNORMAL LOW (ref 4.22–5.81)
RDW: 17 % — ABNORMAL HIGH (ref 11.5–15.5)
WBC: 7.7 10*3/uL (ref 4.0–10.5)
nRBC: 0 % (ref 0.0–0.2)

## 2020-08-01 LAB — URINE CULTURE: Culture: >=100000 — AB

## 2020-08-01 LAB — PREPARE RBC (CROSSMATCH)

## 2020-08-01 SURGERY — ESOPHAGOGASTRODUODENOSCOPY (EGD) WITH PROPOFOL
Anesthesia: General

## 2020-08-01 MED ORDER — FUROSEMIDE 10 MG/ML IJ SOLN
20.0000 mg | Freq: Once | INTRAMUSCULAR | Status: AC
  Administered 2020-08-01: 20 mg via INTRAVENOUS
  Filled 2020-08-01: qty 2

## 2020-08-01 MED ORDER — SODIUM CHLORIDE 0.9% IV SOLUTION: Freq: Once | INTRAVENOUS | Status: AC

## 2020-08-01 MED ORDER — LACTATED RINGERS IV SOLN: INTRAVENOUS | Status: DC

## 2020-08-01 MED ORDER — LAMIVUDINE 10 MG/ML PO SOLN
100.0000 mg | Freq: Every day | ORAL | Status: DC
  Administered 2020-08-02 – 2020-08-04 (×3): 100 mg via ORAL
  Filled 2020-08-01 (×6): qty 10

## 2020-08-01 MED ORDER — LIDOCAINE 2% (20 MG/ML) 5 ML SYRINGE
INTRAMUSCULAR | Status: DC | PRN
  Administered 2020-08-01: 40 mg via INTRAVENOUS

## 2020-08-01 MED ORDER — DOLUTEGRAVIR SODIUM 50 MG PO TABS
50.0000 mg | ORAL_TABLET | Freq: Every day | ORAL | Status: DC
  Administered 2020-08-01 – 2020-08-04 (×4): 50 mg via ORAL
  Filled 2020-08-01 (×6): qty 1

## 2020-08-01 MED ORDER — PROPOFOL 500 MG/50ML IV EMUL
INTRAVENOUS | Status: DC | PRN
  Administered 2020-08-01: 100 ug/kg/min via INTRAVENOUS

## 2020-08-01 MED ORDER — STERILE WATER FOR IRRIGATION IR SOLN
Status: DC | PRN
  Administered 2020-08-01: 1.5 mL

## 2020-08-01 MED ORDER — SODIUM CHLORIDE 0.9 % IV SOLN: INTRAVENOUS | Status: DC

## 2020-08-01 MED ORDER — SUCRALFATE 1 G PO TABS
1.0000 g | ORAL_TABLET | Freq: Four times a day (QID) | ORAL | Status: DC
  Administered 2020-08-01 – 2020-08-04 (×15): 1 g via ORAL
  Filled 2020-08-01 (×14): qty 1

## 2020-08-01 NOTE — Anesthesia Postprocedure Evaluation (Signed)
Anesthesia Post Note  Patient: Noah Robinson  Procedure(s) Performed: ESOPHAGOGASTRODUODENOSCOPY (EGD) WITH PROPOFOL (N/A )  Patient location during evaluation: Phase II Anesthesia Type: General Level of consciousness: awake Pain management: pain level controlled Vital Signs Assessment: post-procedure vital signs reviewed and stable Respiratory status: spontaneous breathing and respiratory function stable Cardiovascular status: blood pressure returned to baseline and stable Postop Assessment: no headache and no apparent nausea or vomiting Anesthetic complications: no Comments: Late entry   No complications documented.   Last Vitals:  Vitals:   08/01/20 1330 08/01/20 1400  BP: 96/62 109/67  Pulse: 85 78  Resp: 19 16  Temp: 36.6 C 36.6 C  SpO2: 99% 100%    Last Pain:  Vitals:   08/01/20 1330  TempSrc:   PainSc: 0-No pain                 Louann Sjogren

## 2020-08-01 NOTE — Op Note (Signed)
[facility] Patient Name: Noah Robinson Procedure Date: 08/01/2020 12:44 PM MRN: [id number] Date of Birth: 1945/12/14 Attending MD: Elon Alas. Abbey Chatters DO CSN: [id number] Age: 75 Admit Type: Outpatient Procedure:                Upper GI endoscopy Indications:              Epigastric abdominal pain Providers:                Elon Alas. Abbey Chatters, DO, Caprice Kluver, Casimer Bilis, Technician Referring MD:              Medicines:                See the Anesthesia note for documentation of the                            administered medications Complications:            No immediate complications. Estimated Blood Loss:     Estimated blood loss: none. Procedure:                Pre-Anesthesia Assessment:                           - The anesthesia plan was to use monitored                            anesthesia care (MAC).                           After obtaining informed consent, the endoscope was                            passed under direct vision. Throughout the                            procedure, the patient's blood pressure, pulse, and                            oxygen saturations were monitored continuously. The                            GIF-H190 ([id number]) scope was introduced through the                            mouth, and advanced to the second part of duodenum.                            The upper GI endoscopy was accomplished without                            difficulty. The patient tolerated the procedure                            well. Scope In: 1:19:14 PM Scope  Out: 1:24:31 PM Total Procedure Duration: 0 hours 5 minutes 17 seconds  Findings:      Grade I varices were found in the lower third of the esophagus.      2 mild Schatzki rings were found in the lower third of the esophagus.      Diffuse moderate inflammation characterized by congestion (edema),       erosions and erythema was found in the entire examined stomach.      The  duodenal bulb, first portion of the duodenum and second portion of       the duodenum were normal. Impression:               - Grade I esophageal varices.                           - Mild Schatzki ring.                           - Gastritis.                           - Normal duodenal bulb, first portion of the                            duodenum and second portion of the duodenum.                           - No specimens collected. Moderate Sedation:      Per Anesthesia Care Recommendation:           - Return patient to hospital ward for ongoing care.                           - Advance diet as tolerated.                           - Recommend outpatient colonoscopy                           - Use sucralfate suspension 1 gram PO QID.                           - Continue on Pepcid, ideally would like patient on                            PPI therapy though may be difficult given allergy                            to pantoprazole Procedure Code(s):        --- Professional ---                           [phone], Esophagogastroduodenoscopy, flexible,                            transoral; diagnostic, including collection of  specimen(s) by brushing or washing, when performed                            (separate procedure) Diagnosis Code(s):        --- Professional ---                           I85.00, Esophageal varices without bleeding                           K22.2, Esophageal obstruction                           K29.70, Gastritis, unspecified, without bleeding                           R10.13, Epigastric pain CPT copyright 2019 [employer]. All rights reserved. The codes documented in this report are preliminary and upon coder review may  be revised to meet current compliance requirements. Elon Alas. Abbey Chatters, DO [place] Abbey Chatters, DO 08/01/2020 1:33:00 PM This report has been signed electronically. Number of Addenda: 0

## 2020-08-01 NOTE — Progress Notes (Signed)
Patient Demographics:    Noah Robinson, is a 75 y.o. male, DOB - August 07, 1945, FHQ:[id number]  Admit date - 07/30/2020   Admitting Physician Ejiroghene Arlyce Dice, MD  Outpatient Primary MD for the patient is [facility]  LOS - 2   Chief Complaint  Patient presents with  . Abnormal Lab        Subjective:    Noah Robinson today has no fevers, no emesis,  No chest pain,   -Tolerated EGD well   Assessment  & Plan :    Principal Problem:   Acute anemia Active Problems:   Hepatitis C, chronic (HCC)   HIV disease (HCC)   Hepatic cirrhosis (HCC)   Chronic kidney disease   Heme positive stool  Brief Summary:- 75 y.o. male resident of the [facility], history of HIV with negative RNA (06/2020), hepatitis C status post treatment with negative HCVRNA (06/2020) cirrhosis, chronic kidney disease 3A, FTT/short term memory loss admitted on 07/30/20 with drop in hgb and Heme+ve stool  A/p 1) acute on chronic anemia due to ABLA--GI consult appreciated hgb is back down to 7.3 from to 8.3  -was 7.1 on admission  -Patient did receive  transfusion of 1 unit of PRBC on 07/30/2020 -Will give additional unit of PRBC on 08/01/2020 -IV Pepcid as patient has PPI allergy -Carafate qid -Defer to GI service regarding timing of possible EGD -Iron studies WNL -Patient had EGD and colonoscopy back in April 2019 -Repeat EGD on 08/01/2020-- with Grade I esophageal varices,                           - Mild Schatzki ring and Gastritis.  2) E. coli and Enterococcus UTI/Prostatitis----from lab work at [facility] was to treat patient for 4 weeks with Levaquin starting 07/26/2020 we will continue this -WBC  12.6 >>11.4 >>7.3  3)Liver cirrhosis--- Levaquin as above in #2 we will also cover for SBP prophylaxis -Ammonia is not elevated  4)Hepatitis B/Hep C/HIV--- treated, HIV RNA and HCV RNA negative/not  detectable on 07/15/2020  5)Sepsis secondary to #2 above-- POA--- patient met sepsis criteria on admission -Continue Levaquin as above #2  6)Prior Stroke with residual left-sided hemiparesis--- previous MRI with multiple microhemorrhages -Suspect HTN related  7)HTN-hold lisinopril,   may use IV labetalol when necessary  Every 4 hours for systolic blood pressure over 160 mmhg  8)GOut--no acute flareup at this time --continue to hold allopurinol  Disposition/Need for in-Hospital Stay- patient unable to be discharged at this time due to --sepsis secondary to UTI/prostatitis requiring antibiotics and IV fluids, possible GI bleed requiring endoluminal evaluation*  Status is: Inpatient  Remains inpatient appropriate because:Please see disposition above   Disposition: The patient is from: SNF              Anticipated d/c is to: SNF              Anticipated d/c date is: 2 days              Patient currently is not medically stable to d/c. Barriers: Not Clinically Stable-   Code Status :  -  Code Status: Full Code   Family Communication:   (  patient is alert, awake and coherent)   Consults  :  Gi  DVT Prophylaxis  :   - SCDs  SCDs Start: 07/31/20 0014    Lab Results  Component Value Date   PLT 190 08/01/2020    Inpatient Medications  Scheduled Meds: . allopurinol  200 mg Oral Daily  . dolutegravir  50 mg Oral Daily  . lamiVUDine  100 mg Oral Daily   Continuous Infusions: . famotidine (PEPCID) IV 20 mg (08/01/20 0947)  . [START ON 08/02/2020] levofloxacin (LEVAQUIN) IV     PRN Meds:.acetaminophen **OR** acetaminophen, ondansetron **OR** ondansetron (ZOFRAN) IV, polyethylene glycol    Anti-infectives (From admission, onward)   Start     Dose/Rate Route Frequency Ordered Stop   08/02/20 1800  Levofloxacin (LEVAQUIN) IVPB 250 mg        250 mg 50 mL/hr over 60 Minutes Intravenous Every 48 hours 07/31/20 1624 08/09/20 2359   08/01/20 2200  vancomycin (VANCOREADY) IVPB 750  mg/150 mL  Status:  Discontinued        750 mg 150 mL/hr over 60 Minutes Intravenous Every 48 hours 07/30/20 2113 07/31/20 1609   08/01/20 1015  lamiVUDine (EPIVIR) 10 MG/ML solution 100 mg        100 mg Oral Daily 08/01/20 0915     08/01/20 1000  dolutegravir (TIVICAY) tablet 50 mg        50 mg Oral Daily 08/01/20 0912     07/31/20 2200  ceFEPIme (MAXIPIME) 2 g in sodium chloride 0.9 % 100 mL IVPB  Status:  Discontinued        2 g 200 mL/hr over 30 Minutes Intravenous Every 24 hours 07/30/20 2100 07/31/20 1609   07/31/20 1800  Levofloxacin (LEVAQUIN) IVPB 250 mg  Status:  Discontinued        250 mg 50 mL/hr over 60 Minutes Intravenous Every 48 hours 07/31/20 1612 07/31/20 1624   07/31/20 1800  Levofloxacin (LEVAQUIN) IVPB 250 mg        250 mg 50 mL/hr over 60 Minutes Intravenous  Once 07/31/20 1625 08/01/20 1218   07/30/20 2100  ceFEPIme (MAXIPIME) 2 g in sodium chloride 0.9 % 100 mL IVPB        2 g 200 mL/hr over 30 Minutes Intravenous  Once 07/30/20 2049 07/30/20 2147   07/30/20 2100  metroNIDAZOLE (FLAGYL) IVPB 500 mg        500 mg 100 mL/hr over 60 Minutes Intravenous  Once 07/30/20 2049 07/30/20 2304   07/30/20 2100  vancomycin (VANCOCIN) IVPB 1000 mg/200 mL premix        1,000 mg 200 mL/hr over 60 Minutes Intravenous  Once 07/30/20 2049 07/31/20 0005        Objective:   Vitals:   08/01/20 0514 08/01/20 1258 08/01/20 1330 08/01/20 1400  BP: 117/69 118/63 96/62 109/67  Pulse: 99 85 85 78  Resp: '19 18 19 16  ' Temp: 98.1 F (36.7 C) 98.3 F (36.8 C) 97.8 F (36.6 C) 97.9 F (36.6 C)  TempSrc:  Oral    SpO2: 100% 100% 99% 100%  Weight:      Height:        Wt Readings from Last 3 Encounters:  07/30/20 62 kg  05/06/20 72.7 kg  04/10/20 67.6 kg     Intake/Output Summary (Last 24 hours) at 08/01/2020 1740 Last data filed at 08/01/2020 1330 Gross per 24 hour  Intake 1083 ml  Output 1600 ml  Net -517 ml   Physical Exam  Gen:- Awake Alert,  In no apparent distress   HEENT:- [place].AT, No sclera icterus Neck-Supple Neck,No JVD,.  Lungs-  CTAB , fair symmetrical air movement CV- S1, S2 normal, regular  Abd-  +ve B.Sounds, Abd Soft, mild epigastric and periumbilical area tenderness without rebound or guarding Extremity/Skin:- No  edema, pedal pulses present  Psych-affect is appropriate, oriented x3 Neuro-residual left-sided hemiparesis, no new focal deficits, no tremors   Data Review:   Micro Results Recent Results (from the past 240 hour(s))  Urine culture     Status: Abnormal   Collection Time: 07/30/20  7:52 PM   Specimen: In/Out Cath Urine  Result Value Ref Range Status   Specimen Description   Final    IN/OUT CATH URINE Performed at [facility], [address] [place], [place] [address]    Special Requests   Final    NONE Performed at [facility], [address], [place], [place] [address]    Culture >=100,000 COLONIES/mL YEAST (A)  Final   Report Status 08/01/2020 FINAL  Final  Blood culture (routine single)     Status: None (Preliminary result)   Collection Time: 07/30/20  8:24 PM   Specimen: BLOOD RIGHT HAND  Result Value Ref Range Status   Specimen Description BLOOD RIGHT HAND  Final   Special Requests   Final    BOTTLES DRAWN AEROBIC AND ANAEROBIC Blood Culture adequate volume   Culture   Final    NO GROWTH 2 DAYS Performed at [facility], [address] [place], [place] [address]    Report Status PENDING  Incomplete  Resp Panel by RT-PCR (Flu A&B, Covid) Nasopharyngeal Swab     Status: None   Collection Time: 07/30/20  9:50 PM   Specimen: Nasopharyngeal Swab; Nasopharyngeal(NP) swabs in vial transport medium  Result Value Ref Range Status   SARS Coronavirus 2 by RT PCR NEGATIVE NEGATIVE Final    Comment: (NOTE) SARS-CoV-2 target nucleic acids are NOT DETECTED.  The SARS-CoV-2 RNA is generally detectable in upper respiratory specimens during the acute phase of infection. The lowest concentration of SARS-CoV-2 viral copies  this assay can detect is 138 copies/mL. A negative result does not preclude SARS-Cov-2 infection and should not be used as the sole basis for treatment or other patient management decisions. A negative result may occur with  improper specimen collection/handling, submission of specimen other than nasopharyngeal swab, presence of viral mutation(s) within the areas targeted by this assay, and inadequate number of viral copies(<138 copies/mL). A negative result must be combined with clinical observations, patient history, and epidemiological information. The expected result is Negative.  Fact Sheet for Patients:  EntrepreneurPulse.com.au  Fact Sheet for Healthcare Providers:  IncredibleEmployment.be  This test is no t yet approved or cleared by the [place] FDA and  has been authorized for detection and/or diagnosis of SARS-CoV-2 by FDA under an Emergency Use Authorization (EUA). This EUA will remain  in effect (meaning this test can be used) for the duration of the COVID-19 declaration under Section 564(b)(1) of the Act, 21 U.S.C.section 360bbb-3(b)(1), unless the authorization is terminated  or revoked sooner.       Influenza A by PCR NEGATIVE NEGATIVE Final   Influenza B by PCR NEGATIVE NEGATIVE Final    Comment: (NOTE) The Xpert Xpress SARS-CoV-2/FLU/RSV plus assay is intended as an aid in the diagnosis of influenza from Nasopharyngeal swab specimens and should not be used as a sole basis for treatment. Nasal washings and aspirates are unacceptable for Xpert  Xpress SARS-CoV-2/FLU/RSV testing.  Fact Sheet for Patients: EntrepreneurPulse.com.au  Fact Sheet for Healthcare Providers: IncredibleEmployment.be  This test is not yet approved or cleared by the [place] FDA and has been authorized for detection and/or diagnosis of SARS-CoV-2 by FDA under an Emergency Use Authorization (EUA). This EUA  will remain in effect (meaning this test can be used) for the duration of the COVID-19 declaration under Section 564(b)(1) of the Act, 21 U.S.C. section 360bbb-3(b)(1), unless the authorization is terminated or revoked.  Performed at [facility], [address] [place], [place] [address]     Radiology Reports DG Chest [place] 1 View  Result Date: 07/30/2020 CLINICAL DATA:  Abnormal labs elevated white count low hemoglobin EXAM: PORTABLE CHEST 1 VIEW COMPARISON:  None. FINDINGS: No focal opacity or pleural effusion. Cardiomediastinal silhouette within normal limits. Aortic atherosclerosis. No pneumothorax. IMPRESSION: No active disease. Electronically Signed   By: Donavan Foil M.D.   On: 07/30/2020 20:10   US ABDOMEN LIMITED RUQ (LIVER/GB)  Result Date: 07/05/2020 CLINICAL DATA:  Hepatitis C. EXAM: ULTRASOUND ABDOMEN LIMITED RIGHT UPPER QUADRANT COMPARISON:  October 08, 2019 FINDINGS: Gallbladder: A 1.3 cm stone is seen in a decompressed gallbladder with no wall thickening, pericholecystic fluid, or Murphy's sign. Common bile duct: Diameter: 2.6 mm Liver: The liver demonstrates a nodular contour consistent with reported cirrhosis. No mass identified. Portal vein is patent on color Doppler imaging with normal direction of blood flow towards the liver. Other: None. IMPRESSION: 1. The gallbladder is relatively contracted limiting evaluation with a single 1.3 cm stone identified. 2. Nodular contour to the liver consistent with cirrhosis.  No mass. Electronically Signed   By: Dorise Bullion III M.D   On: 07/05/2020 22:21     CBC Recent Labs  Lab 07/30/20 1835 07/31/20 0612 08/01/20 0640  WBC 12.6* 11.4* 7.7  HGB 7.1* 8.3*  8.3* 7.3*  HCT 21.7* 24.7*  25.0* 22.7*  PLT 232 195 190  MCV 97.3 93.9 97.4  MCH 31.8 31.6 31.3  MCHC 32.7 33.6 32.2  RDW 15.3 16.7* 17.0*  LYMPHSABS 1.1  --   --   MONOABS 1.3*  --   --   EOSABS 0.0  --   --   BASOSABS 0.0  --   --     Chemistries  Recent Labs   Lab 07/30/20 1835 07/31/20 0612 08/01/20 0640  NA 130* 132* 132*  K 4.8 4.1 3.8  CL 99 106 108  CO2 19* 16* 16*  GLUCOSE 106* 104* 101*  BUN 89* 85* 71*  CREATININE 3.80* 3.42* 2.84*  CALCIUM 8.8* 8.3* 8.3*  AST 45*  --  41  ALT 31  --  26  ALKPHOS 88  --  79  BILITOT 1.0  --  1.1   ------------------------------------------------------------------------------------------------------------------ No results for input(s): CHOL, HDL, LDLCALC, TRIG, CHOLHDL, LDLDIRECT in the last 72 hours.  No results found for: HGBA1C ------------------------------------------------------------------------------------------------------------------ No results for input(s): TSH, T4TOTAL, T3FREE, THYROIDAB in the last 72 hours.  Invalid input(s): FREET3 ------------------------------------------------------------------------------------------------------------------ Recent Labs    07/31/20 0210  FERRITIN 465*  TIBC 271  IRON 48    Coagulation profile Recent Labs  Lab 07/31/20 1144  INR 1.5*    No results for input(s): DDIMER in the last 72 hours.  Cardiac Enzymes No results for input(s): CKMB, TROPONINI, MYOGLOBIN in the last 168 hours.  Invalid input(s): CK ------------------------------------------------------------------------------------------------------------------ No results found for: BNP   Roxan Hockey M.D on 08/01/2020 at 5:40 PM  Go to [email or web address] -  for contact info  [facility] - Office  [phone]

## 2020-08-01 NOTE — Interval H&P Note (Signed)
History and Physical Interval Note:  08/01/2020 1:15 PM  Noah Robinson  has presented today for surgery, with the diagnosis of h/o cirrhosis, heme + stool, transfusion dependent anemia, h/o esopahgeal varices.  The various methods of treatment have been discussed with the patient and family. After consideration of risks, benefits and other options for treatment, the patient has consented to  Procedure(s): ESOPHAGOGASTRODUODENOSCOPY (EGD) WITH PROPOFOL (N/A) ESOPHAGEAL BANDING (N/A) as a surgical intervention.  The patient's history has been reviewed, patient examined, no change in status, stable for surgery.  I have reviewed the patient's chart and labs.  Questions were answered to the patient's satisfaction.     Eloise Harman

## 2020-08-01 NOTE — TOC Initial Note (Signed)
Transition of Care [facility]) - Initial/Assessment Note   Patient Details  Name: Noah Robinson MRN: [id number] Date of Birth: 01-Jun-1945  Transition of Care [facility]) CM/SW Contact:    Sherie Don, LCSW Phone Number: 08/01/2020, 2:50 PM  Clinical Narrative: Patient is a 75 year old male who was admitted for acute anemia. Patient has a history of hepatitis C, HIV, hepatic cirrhosis, and chronic kidney disease.  CSW called patient's daughter, Ranae Plumber, to complete assessment. Per daughter, patient was discharged from [facility] hospital in [place] on 07/25/20 and went to [facility] for rehab. Daughter reported she is working with [place] to apply for LTC Medicaid as the plan is to transition the patient to LTC after rehab due to issues with him being unable to care for himself at home.  Patient will need insurance authorization to return to [facility] for rehab. FL2 started; PASRR verified. TOC to follow.  Expected Discharge Plan: [place] Barriers to Discharge: Continued Medical Work up  Patient Goals and CMS Choice Patient states their goals for this hospitalization and ongoing recovery are:: Return to [facility] for rehab CMS Medicare.gov Compare Post Acute Care list provided to:: Patient Represenative (must comment) Choice offered to / list presented to : Adult Children  Expected Discharge Plan and Services Expected Discharge Plan: [place] In-house Referral: Clinical Social Work Post Acute Care Choice: [place] Living arrangements for the past 2 months: [place]            DME Arranged: N/A DME Agency: NA  Prior Living Arrangements/Services Living arrangements for the past 2 months: [place] Lives with:: Facility Resident Patient language and need for interpreter reviewed:: Yes Need for Family Participation in Patient Care: Yes (Comment) Care giver support system in place?: Yes  (comment) Criminal Activity/Legal Involvement Pertinent to Current Situation/Hospitalization: No - Comment as needed  Permission Sought/Granted Permission sought to share information with : Chartered certified accountant granted to share information with : Yes, Verbal Permission Granted Permission granted to share info w AGENCY: [facility]  Emotional Assessment Appearance:: Appears stated age Orientation: : Oriented to Self,Oriented to Place,Oriented to  Time,Oriented to Situation Alcohol / Substance Use: Not Applicable Psych Involvement: No (comment)  Admission diagnosis:  SIRS (systemic inflammatory response syndrome) (HCC) [R65.10] Symptomatic anemia [D64.9] Acute anemia [D64.9] Patient Active Problem List   Diagnosis Date Noted  . Heme positive stool   . Acute anemia 07/30/2020  . Abnormal gait 05/06/2020  . Iron deficiency anemia 05/06/2020  . Sleep disturbance 05/06/2020  . Itching 04/11/2020  . 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 Thrombocytopenia ([place]) 09/06/2019  . Gout   . HIV (human immunodeficiency virus infection) ([place])   . Hypertension   . Chronic kidney disease   . Hepatitis B core antibody positive   . H. pylori infection 07/27/2017  . Rectal bleeding 07/27/2017  . HIV disease ([place]) 05/02/2017  . Hepatic cirrhosis ([place]) 05/02/2017  . Anemia 03/03/2017  . Hepatitis C, chronic ([place]) 03/03/2017   PCP:  [facility]:   [place], [place] - [address] [place] [address] Phone: [phone] Fax: [phone]  [facility] [place], [place] [address] Phone: [phone] Fax: [phone]  [facility]  [place] [address] Phone: [phone] Fax: [phone]  [id number] -  [place], [place] - [address] [place] [address] Phone: [phone] Fax: [phone]  Readmission Risk Interventions No flowsheet data found.

## 2020-08-01 NOTE — Progress Notes (Addendum)
Subjective:  Patient states he continues to have some abdominal pain.  Tolerating clear liquids.  No vomiting.  3 BMs yesterday.  BM yesterday.  Per nursing staff, stools are brown without overt bleeding, [place] 6.  Objective: Vital signs in last 24 hours: Temp:  [98.1 F (36.7 C)-100.8 F (38.2 C)] 98.1 F (36.7 C) (06/03 0514) Pulse Rate:  [98-110] 99 (06/03 0514) Resp:  [18-20] 19 (06/03 0514) BP: (117-135)/(68-76) 117/69 (06/03 0514) SpO2:  [100 %] 100 % (06/03 0514) Last BM Date: 07/31/20 General:   Alert, somewhat confused.  Answers questions with basic answers.  He believes her president is [employer].  Believes he is at [facility].  Cooperative in NAD Head:  Normocephalic and atraumatic. Eyes:  Sclera clear, no icterus.  Abdomen:  Soft, mild epigastric tenderness, and nondistended.  Normal bowel sounds, without guarding, and without rebound.   Extremities:  Without clubbing, deformity or edema. Neurologic:  Alert and  oriented to person only  Skin:  Intact without significant lesions or rashes. Psych:  Alert and cooperative.  Flat affect.  Intake/Output from previous day: 06/02 0701 - 06/03 0700 In: 1130 [P.O.:1080; IV Piggyback:50] Out: 1700 [Urine:1700] Intake/Output this shift: No intake/output data recorded.  Lab Results: CBC Recent Labs    07/30/20 1835 07/31/20 0612 08/01/20 0640  WBC 12.6* 11.4* 7.7  HGB 7.1* 8.3*  8.3* 7.3*  HCT 21.7* 24.7*  25.0* 22.7*  MCV 97.3 93.9 97.4  PLT 232 195 190   BMET Recent Labs    07/30/20 1835 07/31/20 0612 08/01/20 0640  NA 130* 132* 132*  K 4.8 4.1 3.8  CL 99 106 108  CO2 19* 16* 16*  GLUCOSE 106* 104* 101*  BUN 89* 85* 71*  CREATININE 3.80* 3.42* 2.84*  CALCIUM 8.8* 8.3* 8.3*   LFTs Recent Labs    07/30/20 1835 08/01/20 0640  BILITOT 1.0 1.1  ALKPHOS 88 79  AST 45* 41  ALT 31 26  PROT 9.1* 7.3  ALBUMIN 2.5* 2.0*   No results for input(s): LIPASE in the last 72 hours. PT/INR Recent Labs     07/31/20 1144  LABPROT 17.9*  INR 1.5*      Imaging Studies: DG Chest Port 1 View  Result Date: 07/30/2020 CLINICAL DATA:  Abnormal labs elevated white count low hemoglobin EXAM: PORTABLE CHEST 1 VIEW COMPARISON:  None. FINDINGS: No focal opacity or pleural effusion. Cardiomediastinal silhouette within normal limits. Aortic atherosclerosis. No pneumothorax. IMPRESSION: No active disease. Electronically Signed   By: Donavan Foil M.D.   On: 07/30/2020 20:10   US ABDOMEN LIMITED RUQ (LIVER/GB)  Result Date: 07/05/2020 CLINICAL DATA:  Hepatitis C. EXAM: ULTRASOUND ABDOMEN LIMITED RIGHT UPPER QUADRANT COMPARISON:  October 08, 2019 FINDINGS: Gallbladder: A 1.3 cm stone is seen in a decompressed gallbladder with no wall thickening, pericholecystic fluid, or Murphy's sign. Common bile duct: Diameter: 2.6 mm Liver: The liver demonstrates a nodular contour consistent with reported cirrhosis. No mass identified. Portal vein is patent on color Doppler imaging with normal direction of blood flow towards the liver. Other: None. IMPRESSION: 1. The gallbladder is relatively contracted limiting evaluation with a single 1.3 cm stone identified. 2. Nodular contour to the liver consistent with cirrhosis.  No mass. Electronically Signed   By: Dorise Bullion III M.D   On: 07/05/2020 22:21  [2 weeks]   Assessment: 75 year old male with history of HIV, hepatitis C status posttreatment with current RNA negative having finished Epclusa in January, cirrhosis, chronic kidney disease, recurrent UTIs  presenting to the emergency department from skilled nursing facility for decline in hemoglobin, elevated white blood cell count.  GI consulted for heme positive stool.  In the ED urine suggestive of UTI, met sepsis criteria.  Anemia/heme positive stools: No overt GI bleeding.  Baseline hemoglobin appears to be running in the upper 7-8 range.  Hemoglobin 8.6 on May 27, when checked as an outpatient was down to 7.  He has received 1  unit of packed red blood cells this admission.  Hemoglobin went up to 8.3 posttransfusion.  Hemoglobin down to 7.3 today.  Last EGD and colonoscopy in 2019.  Noted to have grade 1 esophageal varices, H. pylori gastritis status posttreatment/proven eradication, portal hypertensive gastropathy, 3 tubular adenomas (due for surveillance at this time) removed from the colon, colonic AVMs status post treatment, hemorrhoids.  Currently does not appear to have overt GI bleeding although he is heme positive.  Occult bleeding could be from AVMs, portal gastropathy or colopathy, PUD.  Does not appear to be presenting as an esophageal variceal bleed.  Epigastric pain: Patient is pleasantly confused.  Difficult historian.  Continues to complain of epigastric pain with some grimacing on exam.  I do not see any NSAIDs listed on his recent discharge summary.  Recent right upper quadrant ultrasound with contracted gallbladder, single 1.3 cm stone identified.  Cirrhosis: Patient was seen by our practice back in 2019, then lost to follow-up.  Recently established with Dr. Allen Norris.  Cirrhosis felt to be related to alcohol abuse and hepatitis C.  Completed hepatitis C treatment earlier this year under the direction of ID (03/2020) and appears to be cured.  Last EGD in 2019 with grade 1 esophageal varices.  He is due for surveillance EGD at any time. MELD Na 28 this admission, largely driven up by elevated creatinine in setting of CKD. Creatinine improved today.  Patient remains pleasantly confused.  Some questions answered appropriately.  Baseline mentation unclear to me.  Ammonia level yesterday was 10, less likely hepatic encephalopathy playing a role.  He does have a history of MRI brain with hypertensive microhemorrhages.   Plan: 1. To discuss with Dr. Abbey Chatters, consider upper endoscopy this admission given patient's ongoing epigastric pain, heme positive stool, acute on chronic anemia.  Should complete colonoscopy, likely as an  outpatient with his primary gastroenterologist Dr. Allen Norris.  2. Further recommendations to follow.   Laureen Ochs. Bernarda Caffey [facility] [phone] 6/3/202210:36 AM     LOS: 2 days    Addendum: Spoke with patient's daughter Ranae Plumber.  She collaborate a lot of the information already in the chart.  In addition she states the patient lived with her for 4 months from December 2021 to April 2022.  During that time, he was mentally clear.  He was unsteady on his feet and ambulating with cane or walker.  He did suffer a fall and broke 2 of his fingers.  In April 2022 he insisted on going back to live by himself.  Every 2-week period of time he had substantial decline and ended up at [facility] on May 14 through May 27 with severe UTI/prostatitis.  Noted to have mental status changes, MRI brain with findings as previously outlined.  He went to the [facility] in [place] on May 27 for rehabilitation, discharge hemoglobin 8.6.  Presented to our facility due to hemoglobin of 7.  Spoke with Olivia Mackie regarding upper endoscopy to further evaluate abdominal pain, heme positive stool, possibility of needing esophageal variceal banding,  discussed risk and benefits with regards to but not limited to the risk of reaction to medication, bleeding, infection, perforation and she has provided verbal consent.  I also discussed with patient who agrees to procedure.    Linus Orn also desires permanent placement at the [facility] for her dad.

## 2020-08-01 NOTE — Anesthesia Preprocedure Evaluation (Signed)
Anesthesia Evaluation  Patient identified by MRN, date of birth, ID band Patient awake    Reviewed: Allergy & Precautions, H&P , NPO status , Patient's Chart, lab work & pertinent test results, reviewed documented beta blocker date and time   Airway Mallampati: II  TM Distance: >3 FB Neck ROM: full    Dental no notable dental hx. (+) Missing   Pulmonary neg pulmonary ROS,    Pulmonary exam normal breath sounds clear to auscultation       Cardiovascular Exercise Tolerance: Good hypertension, negative cardio ROS   Rhythm:regular Rate:Normal     Neuro/Psych negative neurological ROS  negative psych ROS   GI/Hepatic negative GI ROS, (+) Cirrhosis   Esophageal Varices    , Hepatitis -, Toxin Related, C  Endo/Other  negative endocrine ROS  Renal/GU CRFRenal disease  negative genitourinary   Musculoskeletal   Abdominal   Peds  Hematology  (+) Blood dyscrasia, anemia , HIV,   Anesthesia Other Findings   Reproductive/Obstetrics negative OB ROS                             Anesthesia Physical Anesthesia Plan  ASA: IV and emergent  Anesthesia Plan: General   Post-op Pain Management:    Induction:   PONV Risk Score and Plan: Propofol infusion  Airway Management Planned:   Additional Equipment:   Intra-op Plan:   Post-operative Plan:   Informed Consent: I have reviewed the patients History and Physical, chart, labs and discussed the procedure including the risks, benefits and alternatives for the proposed anesthesia with the patient or authorized representative who has indicated his/her understanding and acceptance.     Dental Advisory Given  Plan Discussed with: CRNA  Anesthesia Plan Comments:         Anesthesia Quick Evaluation

## 2020-08-01 NOTE — Care Management Important Message (Signed)
Important Message  Patient Details  Name: Noah Robinson MRN: [id number] Date of Birth: Jul 28, 1945   Medicare Important Message Given:  Yes     Tommy Medal 08/01/2020, 1:10 PM

## 2020-08-01 NOTE — Transfer of Care (Signed)
Immediate Anesthesia Transfer of Care Note  Patient: Noah Robinson  Procedure(s) Performed: ESOPHAGOGASTRODUODENOSCOPY (EGD) WITH PROPOFOL (N/A ) ESOPHAGEAL BANDING (N/A )  Patient Location: Endoscopy Unit  Anesthesia Type:MAC  Level of Consciousness: sedated and patient cooperative  Airway & Oxygen Therapy: Patient Spontanous Breathing and Patient connected to nasal cannula oxygen  Post-op Assessment: Report given to RN, Post -op Vital signs reviewed and stable and Patient moving all extremities  Post vital signs: Reviewed and stable  Last Vitals:  Vitals Value Taken Time  BP    Temp    Pulse    Resp    SpO2      Last Pain:  Vitals:   08/01/20 1313  TempSrc:   PainSc: 10-Worst pain ever      Patients Stated Pain Goal: 5 (15/83/09 4076)  Complications: No complications documented.

## 2020-08-02 LAB — BPAM RBC
Blood Product Expiration Date: 202206302359
Blood Product Expiration Date: 202207112359
ISSUE DATE / TIME: 202206012138
ISSUE DATE / TIME: 202206032016
Unit Type and Rh: 5100
Unit Type and Rh: 5100

## 2020-08-02 LAB — TYPE AND SCREEN
ABO/RH(D): O POS
Antibody Screen: NEGATIVE
Unit division: 0
Unit division: 0

## 2020-08-02 LAB — CBC
HCT: 32 % — ABNORMAL LOW (ref 39.0–52.0)
Hemoglobin: 10.3 g/dL — ABNORMAL LOW (ref 13.0–17.0)
MCH: 31.4 pg (ref 26.0–34.0)
MCHC: 32.2 g/dL (ref 30.0–36.0)
MCV: 97.6 fL (ref 80.0–100.0)
Platelets: 227 10*3/uL (ref 150–400)
RBC: 3.28 MIL/uL — ABNORMAL LOW (ref 4.22–5.81)
RDW: 16.5 % — ABNORMAL HIGH (ref 11.5–15.5)
WBC: 8 10*3/uL (ref 4.0–10.5)
nRBC: 0 % (ref 0.0–0.2)

## 2020-08-02 LAB — CREATININE, SERUM
Creatinine, Ser: 2.85 mg/dL — ABNORMAL HIGH (ref 0.61–1.24)
GFR, Estimated: 23 mL/min — ABNORMAL LOW (ref >60)

## 2020-08-02 LAB — BASIC METABOLIC PANEL
Anion gap: 10 (ref 5–15)
BUN: 64 mg/dL — ABNORMAL HIGH (ref 8–23)
CO2: 20 mmol/L — ABNORMAL LOW (ref 22–32)
Calcium: 8.8 mg/dL — ABNORMAL LOW (ref 8.9–10.3)
Chloride: 106 mmol/L (ref 98–111)
Creatinine, Ser: 2.85 mg/dL — ABNORMAL HIGH (ref 0.61–1.24)
GFR, Estimated: 23 mL/min — ABNORMAL LOW (ref >60)
Glucose, Bld: 99 mg/dL (ref 70–99)
Potassium: 3.6 mmol/L (ref 3.5–5.1)
Sodium: 136 mmol/L (ref 135–145)

## 2020-08-02 MED ORDER — FAMOTIDINE IN NACL 20-0.9 MG/50ML-% IV SOLN
20.0000 mg | INTRAVENOUS | Status: DC
  Administered 2020-08-02 – 2020-08-04 (×3): 20 mg via INTRAVENOUS
  Filled 2020-08-02 (×3): qty 50

## 2020-08-02 MED ORDER — CHLORHEXIDINE GLUCONATE CLOTH 2 % EX PADS
6.0000 | MEDICATED_PAD | Freq: Every day | CUTANEOUS | Status: DC
  Administered 2020-08-02 – 2020-08-03 (×2): 6 via TOPICAL

## 2020-08-02 NOTE — Progress Notes (Addendum)
Patient Demographics:    Noah Robinson, is a 75 y.o. male, DOB - 18-Jun-1945, WLS:[id number]  Admit date - 07/30/2020   Admitting Physician Ejiroghene Arlyce Dice, MD  Outpatient Primary MD for the patient is [facility]  LOS - 3   Chief Complaint  Patient presents with  . Abnormal Lab        Subjective:    Noah Robinson today has no fevers, no emesis,  No chest pain,   -Tolerating clear liquid diet -   Assessment  & Plan :    Principal Problem:   Acute anemia Active Problems:   Hepatitis C, chronic (HCC)   HIV disease (HCC)   Hepatic cirrhosis (HCC)   Chronic kidney disease   Heme positive stool  Brief Summary:- 75 y.o. male resident of the [facility], history of HIV with negative RNA (06/2020), hepatitis C status post treatment with negative HCVRNA (06/2020) cirrhosis, chronic kidney disease 3A, FTT/short term memory loss admitted on 07/30/20 with drop in hgb and Heme+ve stool  A/p 1) acute on chronic anemia due to ABLA--GI consult appreciated hgb is back up to 10.3 -was 7.1 on admission  -Patient did receive  transfusion of 1 unit of PRBC on 07/30/2020 -additional unit of PRBC on 08/01/2020 -IV Pepcid as patient has PPI allergy -Carafate qid -Iron studies WNL -Patient had EGD and colonoscopy back in April 2019 -Repeat EGD on 08/01/2020-- with Grade I esophageal varices,                           - Mild Schatzki ring and Gastritis. -Per GI service okay to advance to full liquid diet, possible colonoscopy as outpatient  2) E. coli and Enterococcus UTI/Prostatitis----from lab work at [facility] was to treat patient for 4 weeks with Levaquin starting 07/26/2020 we will continue this -WBC  12.6 >>11.4 >>8.0  3)Liver cirrhosis--- Levaquin as above in #2 we will also cover for SBP prophylaxis -Ammonia is not elevated  4)Hepatitis B/Hep C/HIV--- treated, HIV RNA and  HCV RNA negative/not detectable on 07/15/2020  5)Sepsis secondary to #2 above-- POA--- patient met sepsis criteria on admission -Continue Levaquin as above #2  6)Prior Stroke with residual left-sided hemiparesis--- previous MRI with multiple microhemorrhages -Suspect HTN related  7)HTN-hold lisinopril,   may use IV labetalol when necessary  Every 4 hours for systolic blood pressure over 160 mmhg  8)GOut--no acute flareup at this time --continue to hold allopurinol  Disposition/Need for in-Hospital Stay- patient unable to be discharged at this time due to --sepsis secondary to UTI/prostatitis requiring antibiotics and IV fluids,--possible discharge on 08/03/2020 if no further bleeding and tolerating oral intake  Status is: Inpatient  Remains inpatient appropriate because:Please see disposition above   Disposition: The patient is from: SNF              Anticipated d/c is to: SNF              Anticipated d/c date is: 1 day              Patient currently is not medically stable to d/c. Barriers: Not Clinically Stable-   Code Status :  -  Code Status: Full  Code   Family Communication:   (patient is alert, awake and coherent)   Consults  :  Gi  DVT Prophylaxis  :   - SCDs  SCDs Start: 07/31/20 0014   Lab Results  Component Value Date   PLT 227 08/02/2020    Inpatient Medications  Scheduled Meds: . allopurinol  200 mg Oral Daily  . Chlorhexidine Gluconate Cloth  6 each Topical Daily  . dolutegravir  50 mg Oral Daily  . lamiVUDine  100 mg Oral Daily  . sucralfate  1 g Oral QID   Continuous Infusions: . famotidine (PEPCID) IV    . levofloxacin (LEVAQUIN) IV 250 mg (08/02/20 1647)   PRN Meds:.acetaminophen **OR** acetaminophen, ondansetron **OR** ondansetron (ZOFRAN) IV, polyethylene glycol    Anti-infectives (From admission, onward)   Start     Dose/Rate Route Frequency Ordered Stop   08/02/20 1800  Levofloxacin (LEVAQUIN) IVPB 250 mg        250 mg 50 mL/hr over 60  Minutes Intravenous Every 48 hours 07/31/20 1624 08/09/20 2359   08/01/20 2200  vancomycin (VANCOREADY) IVPB 750 mg/150 mL  Status:  Discontinued        750 mg 150 mL/hr over 60 Minutes Intravenous Every 48 hours 07/30/20 2113 07/31/20 1609   08/01/20 1015  lamiVUDine (EPIVIR) 10 MG/ML solution 100 mg        100 mg Oral Daily 08/01/20 0915     08/01/20 1000  dolutegravir (TIVICAY) tablet 50 mg        50 mg Oral Daily 08/01/20 0912     07/31/20 2200  ceFEPIme (MAXIPIME) 2 g in sodium chloride 0.9 % 100 mL IVPB  Status:  Discontinued        2 g 200 mL/hr over 30 Minutes Intravenous Every 24 hours 07/30/20 2100 07/31/20 1609   07/31/20 1800  Levofloxacin (LEVAQUIN) IVPB 250 mg  Status:  Discontinued        250 mg 50 mL/hr over 60 Minutes Intravenous Every 48 hours 07/31/20 1612 07/31/20 1624   07/31/20 1800  Levofloxacin (LEVAQUIN) IVPB 250 mg        250 mg 50 mL/hr over 60 Minutes Intravenous  Once 07/31/20 1625 08/01/20 1218   07/30/20 2100  ceFEPIme (MAXIPIME) 2 g in sodium chloride 0.9 % 100 mL IVPB        2 g 200 mL/hr over 30 Minutes Intravenous  Once 07/30/20 2049 07/30/20 2147   07/30/20 2100  metroNIDAZOLE (FLAGYL) IVPB 500 mg        500 mg 100 mL/hr over 60 Minutes Intravenous  Once 07/30/20 2049 07/30/20 2304   07/30/20 2100  vancomycin (VANCOCIN) IVPB 1000 mg/200 mL premix        1,000 mg 200 mL/hr over 60 Minutes Intravenous  Once 07/30/20 2049 07/31/20 0005        Objective:   Vitals:   08/01/20 2111 08/01/20 2239 08/02/20 0510 08/02/20 1317  BP: 130/76 120/65 (!) 142/81 (!) 153/79  Pulse: 97 100 95 96  Resp: '18 20 19 16  ' Temp: 97.8 F (36.6 C) 98.2 F (36.8 C) 98.5 F (36.9 C) 98 F (36.7 C)  TempSrc: Oral Oral  Oral  SpO2: 100% 100%  100%  Weight:      Height:        Wt Readings from Last 3 Encounters:  07/30/20 62 kg  05/06/20 72.7 kg  04/10/20 67.6 kg     Intake/Output Summary (Last 24 hours) at 08/02/2020 7672 Last data filed  at 08/02/2020  1500 Gross per 24 hour  Intake 697.5 ml  Output 2050 ml  Net -1352.5 ml   Physical Exam  Gen:- Awake Alert,  In no apparent distress  HEENT:- [place].AT, No sclera icterus Neck-Supple Neck,No JVD,.  Lungs-  CTAB , fair symmetrical air movement CV- S1, S2 normal, regular  Abd-  +ve B.Sounds, Abd Soft, mild epigastric and periumbilical area tenderness without rebound or guarding Extremity/Skin:- No  edema, pedal pulses present  Psych-affect is appropriate, oriented x3 Neuro-residual left-sided hemiparesis, no new focal deficits, no tremors   Data Review:   Micro Results Recent Results (from the past 240 hour(s))  Urine culture     Status: Abnormal   Collection Time: 07/30/20  7:52 PM   Specimen: In/Out Cath Urine  Result Value Ref Range Status   Specimen Description   Final    IN/OUT CATH URINE Performed at [facility], [address] [place], [place] [address]    Special Requests   Final    NONE Performed at [facility], [address], [place], [place] [address]    Culture >=100,000 COLONIES/mL YEAST (A)  Final   Report Status 08/01/2020 FINAL  Final  Blood culture (routine single)     Status: None (Preliminary result)   Collection Time: 07/30/20  8:24 PM   Specimen: BLOOD RIGHT HAND  Result Value Ref Range Status   Specimen Description BLOOD RIGHT HAND  Final   Special Requests   Final    BOTTLES DRAWN AEROBIC AND ANAEROBIC Blood Culture adequate volume   Culture   Final    NO GROWTH 3 DAYS Performed at [facility], [address] [place], [place] [address]    Report Status PENDING  Incomplete  Resp Panel by RT-PCR (Flu A&B, Covid) Nasopharyngeal Swab     Status: None   Collection Time: 07/30/20  9:50 PM   Specimen: Nasopharyngeal Swab; Nasopharyngeal(NP) swabs in vial transport medium  Result Value Ref Range Status   SARS Coronavirus 2 by RT PCR NEGATIVE NEGATIVE Final    Comment: (NOTE) SARS-CoV-2 target nucleic acids are NOT DETECTED.  The SARS-CoV-2 RNA is  generally detectable in upper respiratory specimens during the acute phase of infection. The lowest concentration of SARS-CoV-2 viral copies this assay can detect is 138 copies/mL. A negative result does not preclude SARS-Cov-2 infection and should not be used as the sole basis for treatment or other patient management decisions. A negative result may occur with  improper specimen collection/handling, submission of specimen other than nasopharyngeal swab, presence of viral mutation(s) within the areas targeted by this assay, and inadequate number of viral copies(<138 copies/mL). A negative result must be combined with clinical observations, patient history, and epidemiological information. The expected result is Negative.  Fact Sheet for Patients:  EntrepreneurPulse.com.au  Fact Sheet for Healthcare Providers:  IncredibleEmployment.be  This test is no t yet approved or cleared by the [place] FDA and  has been authorized for detection and/or diagnosis of SARS-CoV-2 by FDA under an Emergency Use Authorization (EUA). This EUA will remain  in effect (meaning this test can be used) for the duration of the COVID-19 declaration under Section 564(b)(1) of the Act, 21 U.S.C.section 360bbb-3(b)(1), unless the authorization is terminated  or revoked sooner.       Influenza A by PCR NEGATIVE NEGATIVE Final   Influenza B by PCR NEGATIVE NEGATIVE Final    Comment: (NOTE) The Xpert Xpress SARS-CoV-2/FLU/RSV plus assay is intended as an aid in the diagnosis of  influenza from Nasopharyngeal swab specimens and should not be used as a sole basis for treatment. Nasal washings and aspirates are unacceptable for Xpert Xpress SARS-CoV-2/FLU/RSV testing.  Fact Sheet for Patients: EntrepreneurPulse.com.au  Fact Sheet for Healthcare Providers: IncredibleEmployment.be  This test is not yet approved or cleared by the [place] FDA and has been authorized for detection and/or diagnosis of SARS-CoV-2 by FDA under an Emergency Use Authorization (EUA). This EUA will remain in effect (meaning this test can be used) for the duration of the COVID-19 declaration under Section 564(b)(1) of the Act, 21 U.S.C. section 360bbb-3(b)(1), unless the authorization is terminated or revoked.  Performed at [facility], [address] [place], [place] [address]     Radiology Reports DG Chest [place] 1 View  Result Date: 07/30/2020 CLINICAL DATA:  Abnormal labs elevated white count low hemoglobin EXAM: PORTABLE CHEST 1 VIEW COMPARISON:  None. FINDINGS: No focal opacity or pleural effusion. Cardiomediastinal silhouette within normal limits. Aortic atherosclerosis. No pneumothorax. IMPRESSION: No active disease. Electronically Signed   By: Donavan Foil M.D.   On: 07/30/2020 20:10     CBC Recent Labs  Lab 07/30/20 1835 07/31/20 0612 08/01/20 0640 08/02/20 0845  WBC 12.6* 11.4* 7.7 8.0  HGB 7.1* 8.3*  8.3* 7.3* 10.3*  HCT 21.7* 24.7*  25.0* 22.7* 32.0*  PLT 232 195 190 227  MCV 97.3 93.9 97.4 97.6  MCH 31.8 31.6 31.3 31.4  MCHC 32.7 33.6 32.2 32.2  RDW 15.3 16.7* 17.0* 16.5*  LYMPHSABS 1.1  --   --   --   MONOABS 1.3*  --   --   --   EOSABS 0.0  --   --   --   BASOSABS 0.0  --   --   --     Chemistries  Recent Labs  Lab 07/30/20 1835 07/31/20 0612 08/01/20 0640 08/02/20 0453 08/02/20 0845  NA 130* 132* 132*  --  136  K 4.8 4.1 3.8  --  3.6  CL 99 106 108  --  106  CO2 19* 16* 16*  --  20*  GLUCOSE 106* 104* 101*  --  99  BUN 89* 85* 71*  --  64*  CREATININE 3.80* 3.42* 2.84* 2.85* 2.85*  CALCIUM 8.8* 8.3* 8.3*  --  8.8*  AST 45*  --  41  --   --   ALT 31  --  26  --   --   ALKPHOS 88  --  79  --   --   BILITOT 1.0  --  1.1  --   --    ------------------------------------------------------------------------------------------------------------------ No results for input(s): CHOL, HDL, LDLCALC, TRIG,  CHOLHDL, LDLDIRECT in the last 72 hours.  No results found for: HGBA1C ------------------------------------------------------------------------------------------------------------------ No results for input(s): TSH, T4TOTAL, T3FREE, THYROIDAB in the last 72 hours.  Invalid input(s): FREET3 ------------------------------------------------------------------------------------------------------------------ Recent Labs    07/31/20 0210  FERRITIN 465*  TIBC 271  IRON 48    Coagulation profile Recent Labs  Lab 07/31/20 1144  INR 1.5*    No results for input(s): DDIMER in the last 72 hours.  Cardiac Enzymes No results for input(s): CKMB, TROPONINI, MYOGLOBIN in the last 168 hours.  Invalid input(s): CK ------------------------------------------------------------------------------------------------------------------ No results found for: BNP   Roxan Hockey M.D on 08/02/2020 at 6:08 PM  Go to [email or web address] - for contact info  [facility] - Office  [phone]

## 2020-08-02 NOTE — Progress Notes (Signed)
Subjective: Patient tired on examination and interview today.  Tolerated EGD yesterday.  Findings include grade 1 esophageal varices as well as multiple Schatzki's rings.  Also diffuse gastritis.  No active bleeding or stigmata of recent bleeding.  Objective: Vital signs in last 24 hours: Temp:  [97.8 F (36.6 C)-98.6 F (37 C)] 98.5 F (36.9 C) (06/04 0510) Pulse Rate:  [78-103] 95 (06/04 0510) Resp:  [16-20] 19 (06/04 0510) BP: (96-142)/(62-81) 142/81 (06/04 0510) SpO2:  [99 %-100 %] 100 % (06/03 2239) Last BM Date: 07/31/20 General:   Alert and oriented, pleasant Head:  Normocephalic and atraumatic. Eyes:  No icterus, sclera clear. Conjuctiva pink.  Mouth:  Without lesions, mucosa pink and moist.  Neck:  Supple, without thyromegaly or masses.  Abdomen:  Bowel sounds present, soft, non-tender, non-distended. No HSM or hernias noted. No rebound or guarding. No masses appreciated  Msk:  Symmetrical without gross deformities. Normal posture. Pulses:  Normal pulses noted. Extremities:  Without clubbing or edema. Neurologic:  Alert and  oriented x4;  grossly normal neurologically. Skin:  Warm and dry, intact without significant lesions.  Cervical Nodes:  No significant cervical adenopathy. Psych:  Alert and cooperative. Normal mood and affect.  Intake/Output from previous day: 06/03 0701 - 06/04 0700 In: 860.5 [P.O.:353; I.V.:233.5; Blood:274] Out: 5830 [Urine:2550] Intake/Output this shift: Total I/O In: 390 [P.O.:240; IV Piggyback:150] Out: -   Lab Results: Recent Labs    07/31/20 0612 08/01/20 0640 08/02/20 0845  WBC 11.4* 7.7 8.0  HGB 8.3*  8.3* 7.3* 10.3*  HCT 24.7*  25.0* 22.7* 32.0*  PLT 195 190 227   BMET Recent Labs    07/31/20 0612 08/01/20 0640 08/02/20 0453 08/02/20 0845  NA 132* 132*  --  136  K 4.1 3.8  --  3.6  CL 106 108  --  106  CO2 16* 16*  --  20*  GLUCOSE 104* 101*  --  99  BUN 85* 71*  --  64*  CREATININE 3.42* 2.84* 2.85* 2.85*   CALCIUM 8.3* 8.3*  --  8.8*   LFT Recent Labs    07/30/20 1835 08/01/20 0640  PROT 9.1* 7.3  ALBUMIN 2.5* 2.0*  AST 45* 41  ALT 31 26  ALKPHOS 88 79  BILITOT 1.0 1.1   PT/INR Recent Labs    07/31/20 1144  LABPROT 17.9*  INR 1.5*   Hepatitis Panel No results for input(s): HEPBSAG, HCVAB, HEPAIGM, HEPBIGM in the last 72 hours.   Studies/Results: No results found.  Assessment: *Anemia with heme positive stools *Epigastric pain *Cirrhosis-etiology likely chronic hepatitis C and alcohol abuse  Plan: Patient's EGD 08/01/2020 with grade 1 esophageal varices, multiple Schatzki's rings, diffuse gastritis/portal hypertensive gastropathy, no active or stigmata of bleeding.  Ideally we would like to place patient on PPI therapy though will be difficult given his allergy to pantoprazole in the past with hives.  Continue on Pepcid as well as Carafate.    We will check H. pylori IgG and treat if positive.  Continue to monitor H&H and transfuse for less than 7.  We will start patient on full liquid diet today.  Will need colonoscopy in the outpatient setting.  Recommend palliative care if not already involved.  GI to continue to follow.  Elon Alas. Abbey Chatters, D.O. Gastroenterology and Hepatology [facility]   LOS: 3 days    08/02/2020, 11:05 AM

## 2020-08-03 ENCOUNTER — Telehealth: Payer: Self-pay | Admitting: Internal Medicine

## 2020-08-03 LAB — CBC
HCT: 32.2 % — ABNORMAL LOW (ref 39.0–52.0)
Hemoglobin: 10.4 g/dL — ABNORMAL LOW (ref 13.0–17.0)
MCH: 31 pg (ref 26.0–34.0)
MCHC: 32.3 g/dL (ref 30.0–36.0)
MCV: 96.1 fL (ref 80.0–100.0)
Platelets: 229 10*3/uL (ref 150–400)
RBC: 3.35 MIL/uL — ABNORMAL LOW (ref 4.22–5.81)
RDW: 16.1 % — ABNORMAL HIGH (ref 11.5–15.5)
WBC: 8.9 10*3/uL (ref 4.0–10.5)
nRBC: 0 % (ref 0.0–0.2)

## 2020-08-03 LAB — BASIC METABOLIC PANEL
Anion gap: 9 (ref 5–15)
BUN: 63 mg/dL — ABNORMAL HIGH (ref 8–23)
CO2: 21 mmol/L — ABNORMAL LOW (ref 22–32)
Calcium: 8.8 mg/dL — ABNORMAL LOW (ref 8.9–10.3)
Chloride: 106 mmol/L (ref 98–111)
Creatinine, Ser: 2.51 mg/dL — ABNORMAL HIGH (ref 0.61–1.24)
GFR, Estimated: 26 mL/min — ABNORMAL LOW (ref >60)
Glucose, Bld: 102 mg/dL — ABNORMAL HIGH (ref 70–99)
Potassium: 3.9 mmol/L (ref 3.5–5.1)
Sodium: 136 mmol/L (ref 135–145)

## 2020-08-03 NOTE — Telephone Encounter (Signed)
Please arrange hospital follow-up with one of the apps.  Thank you

## 2020-08-03 NOTE — Progress Notes (Signed)
Patient Demographics:    Noah Robinson, is a 75 y.o. male, DOB - May 07, 1945, [id number]  Admit date - 07/30/2020   Admitting Physician Ejiroghene Arlyce Dice, MD  Outpatient Primary MD for the patient is [facility]  LOS - 4   Chief Complaint  Patient presents with  . Abnormal Lab        Subjective:    Noah Robinson today has no fevers, no emesis,  No chest pain,   -Tolerating  liquid diet---patient does not really eat well   Assessment  & Plan :    Principal Problem:   Acute anemia Active Problems:   Hepatic cirrhosis (HCC)   Chronic kidney disease   Hepatitis C, chronic (HCC)   HIV disease (HCC)   Heme positive stool  Brief Summary:- 75 y.o. male resident of the [facility], history of HIV with negative RNA (06/2020), hepatitis C status post treatment with negative HCVRNA (06/2020) cirrhosis, chronic kidney disease 3A, FTT/short term memory loss admitted on 07/30/20 with drop in hgb and Heme+ve stool  A/p 1) acute on chronic anemia due to ABLA--GI consult appreciated hgb is back up to 10.4 -was 7.1 on admission  -Patient did receive  transfusion of 1 unit of PRBC on 07/30/2020 -additional unit of PRBC on 08/01/2020 -IV Pepcid as patient has PPI allergy -Carafate qid -Iron studies WNL -Patient had EGD and colonoscopy back in April 2019 -Repeat EGD on 08/01/2020-- with Grade I esophageal varices,                           - Mild Schatzki ring and Gastritis. -Per GI service okay to advance to full liquid diet, possible colonoscopy as outpatient  2) E. coli and Enterococcus UTI/Prostatitis----from lab work at [facility] was to treat patient for 4 weeks with Levaquin starting 07/26/2020 we will continue this -WBC  12.6 >>11.4 >>8.9  3)Liver cirrhosis--- Levaquin as above in #2 we will also cover for SBP prophylaxis -Ammonia is not elevated  4)Hepatitis B/Hep  C/HIV--- treated, HIV RNA and HCV RNA negative/not detectable on 07/15/2020  5)Sepsis secondary to #2 above-- POA--- patient met sepsis criteria on admission -Continue Levaquin as above #2  6)Prior Stroke with residual left-sided hemiparesis--- previous MRI with multiple microhemorrhages -Suspect HTN related  7)HTN-hold lisinopril,   may use IV labetalol when necessary  Every 4 hours for systolic blood pressure over 160 mmhg  8)GOut--no acute flareup at this time --continue to hold allopurinol  9)Generalized weakness/deconditioning/ambulatory dysfunction --- 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----requires lots of help with mobility related ADLs -PT reevaluation appreciated recommends no further skilled PT needed at this time as patient appears to be at baseline  Disposition/Need for in-Hospital Stay- patient unable to be discharged at this time due to --sepsis secondary to UTI/prostatitis requiring antibiotics and IV fluids,-- -okay to discharge back to facility when insurance approval is received  Status is: Inpatient  Remains inpatient appropriate because:Please see disposition above   Disposition: The patient is from: SNF              Anticipated d/c is to: SNF  Anticipated d/c date is: 1 day              Patient currently is medically stable to d/c. Barriers: -okay to discharge back to facility when insurance approval is receive  Code Status :  -  Code Status: Full Code   Family Communication:   (patient is alert, awake and coherent)   Consults  :  Gi  DVT Prophylaxis  :   - SCDs  SCDs Start: 07/31/20 0014   Lab Results  Component Value Date   PLT 229 08/03/2020    Inpatient Medications  Scheduled Meds: . allopurinol  200 mg Oral Daily  . Chlorhexidine Gluconate Cloth  6 each Topical Daily  . dolutegravir  50 mg Oral Daily  . lamiVUDine  100 mg Oral Daily  . sucralfate  1 g Oral QID   Continuous Infusions: . famotidine (PEPCID) IV  20 mg (08/02/20 2018)  . levofloxacin (LEVAQUIN) IV 250 mg (08/02/20 1647)   PRN Meds:.acetaminophen **OR** acetaminophen, ondansetron **OR** ondansetron (ZOFRAN) IV, polyethylene glycol    Anti-infectives (From admission, onward)   Start     Dose/Rate Route Frequency Ordered Stop   08/02/20 1800  Levofloxacin (LEVAQUIN) IVPB 250 mg        250 mg 50 mL/hr over 60 Minutes Intravenous Every 48 hours 07/31/20 1624 08/09/20 2359   08/01/20 2200  vancomycin (VANCOREADY) IVPB 750 mg/150 mL  Status:  Discontinued        750 mg 150 mL/hr over 60 Minutes Intravenous Every 48 hours 07/30/20 2113 07/31/20 1609   08/01/20 1015  lamiVUDine (EPIVIR) 10 MG/ML solution 100 mg        100 mg Oral Daily 08/01/20 0915     08/01/20 1000  dolutegravir (TIVICAY) tablet 50 mg        50 mg Oral Daily 08/01/20 0912     07/31/20 2200  ceFEPIme (MAXIPIME) 2 g in sodium chloride 0.9 % 100 mL IVPB  Status:  Discontinued        2 g 200 mL/hr over 30 Minutes Intravenous Every 24 hours 07/30/20 2100 07/31/20 1609   07/31/20 1800  Levofloxacin (LEVAQUIN) IVPB 250 mg  Status:  Discontinued        250 mg 50 mL/hr over 60 Minutes Intravenous Every 48 hours 07/31/20 1612 07/31/20 1624   07/31/20 1800  Levofloxacin (LEVAQUIN) IVPB 250 mg        250 mg 50 mL/hr over 60 Minutes Intravenous  Once 07/31/20 1625 08/01/20 1218   07/30/20 2100  ceFEPIme (MAXIPIME) 2 g in sodium chloride 0.9 % 100 mL IVPB        2 g 200 mL/hr over 30 Minutes Intravenous  Once 07/30/20 2049 07/30/20 2147   07/30/20 2100  metroNIDAZOLE (FLAGYL) IVPB 500 mg        500 mg 100 mL/hr over 60 Minutes Intravenous  Once 07/30/20 2049 07/30/20 2304   07/30/20 2100  vancomycin (VANCOCIN) IVPB 1000 mg/200 mL premix        1,000 mg 200 mL/hr over 60 Minutes Intravenous  Once 07/30/20 2049 07/31/20 0005        Objective:   Vitals:   08/02/20 0510 08/02/20 1317 08/02/20 2047 08/03/20 0447  BP: (!) 142/81 (!) 153/79 (!) 151/85 123/70  Pulse: 95 96 95  (!) 101  Resp: _0 Temp: 98.5 F (36.9 C) 98 F (36.7 C) 98.4 F (36.9 C) 98.3 F (36.8 C)  TempSrc:  Oral Oral Oral  SpO2:  100% 100% 100%  Weight:      Height:        Wt Readings from Last 3 Encounters:  07/30/20 62 kg  05/06/20 72.7 kg  04/10/20 67.6 kg     Intake/Output Summary (Last 24 hours) at 08/03/2020 1358 Last data filed at 08/03/2020 1229 Gross per 24 hour  Intake 840 ml  Output 1700 ml  Net -860 ml   Physical Exam  Gen:- Awake Alert,  In no apparent distress  HEENT:- [place].AT, No sclera icterus Neck-Supple Neck,No JVD,.  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- GU- Foley in situ   Data Review:   Micro Results Recent Results (from the past 240 hour(s))  Urine culture     Status: Abnormal   Collection Time: 07/30/20  7:52 PM   Specimen: In/Out Cath Urine  Result Value Ref Range Status   Specimen Description   Final    IN/OUT CATH URINE Performed at [facility], [address] [place], [place] [address]    Special Requests   Final    NONE Performed at [facility], [address], [place], [place] [address]    Culture >=100,000 COLONIES/mL YEAST (A)  Final   Report Status 08/01/2020 FINAL  Final  Blood culture (routine single)     Status: None (Preliminary result)   Collection Time: 07/30/20  8:24 PM   Specimen: BLOOD RIGHT HAND  Result Value Ref Range Status   Specimen Description BLOOD RIGHT HAND  Final   Special Requests   Final    BOTTLES DRAWN AEROBIC AND ANAEROBIC Blood Culture adequate volume   Culture   Final    NO GROWTH 4 DAYS Performed at [facility], [address]  [place], [place] [address]    Report Status PENDING  Incomplete  Resp Panel by RT-PCR (Flu A&B, Covid) Nasopharyngeal Swab     Status: None   Collection Time: 07/30/20  9:50 PM   Specimen: Nasopharyngeal Swab; Nasopharyngeal(NP) swabs in vial transport medium  Result Value Ref Range Status   SARS Coronavirus 2 by RT PCR NEGATIVE NEGATIVE Final    Comment: (NOTE) SARS-CoV-2 target nucleic acids are NOT DETECTED.  The SARS-CoV-2 RNA is generally detectable in upper respiratory specimens during the acute phase of infection. The lowest concentration of SARS-CoV-2 viral copies this assay can detect is 138 copies/mL. A negative result does not preclude SARS-Cov-2 infection and should not be used as the sole basis for treatment or other patient management decisions. A negative result may occur with  improper specimen collection/handling, submission of specimen other than nasopharyngeal swab, presence of viral mutation(s) within the areas targeted by this assay, and inadequate number of viral copies(<138 copies/mL). A negative result must be combined with clinical observations, patient history, and epidemiological information. The expected result is Negative.  Fact Sheet for Patients:  EntrepreneurPulse.com.au  Fact Sheet for Healthcare Providers:  IncredibleEmployment.be  This test is no t yet approved or cleared by the [place] FDA and  has been authorized for detection and/or diagnosis of SARS-CoV-2 by FDA under an Emergency Use Authorization (EUA). This EUA will remain  in effect (meaning this test can be used) for the duration of  the COVID-19 declaration under Section 564(b)(1) of the Act, 21 U.S.C.section 360bbb-3(b)(1), unless the authorization is terminated  or revoked sooner.       Influenza A by PCR NEGATIVE NEGATIVE Final   Influenza B by PCR NEGATIVE NEGATIVE Final    Comment: (NOTE) The Xpert Xpress SARS-CoV-2/FLU/RSV plus assay is  intended as an aid in the diagnosis of influenza from Nasopharyngeal swab specimens and should not be used as a sole basis for treatment. Nasal washings and aspirates are unacceptable for Xpert Xpress SARS-CoV-2/FLU/RSV testing.  Fact Sheet for Patients: EntrepreneurPulse.com.au  Fact Sheet for Healthcare Providers: IncredibleEmployment.be  This test is not yet approved or cleared by the [place] FDA and has been authorized for detection and/or diagnosis of SARS-CoV-2 by FDA under an Emergency Use Authorization (EUA). This EUA will remain in effect (meaning this test can be used) for the duration of the COVID-19 declaration under Section 564(b)(1) of the Act, 21 U.S.C. section 360bbb-3(b)(1), unless the authorization is terminated or revoked.  Performed at [facility], [address] [place], [place] [address]     Radiology Reports DG Chest [place] 1 View  Result Date: 07/30/2020 CLINICAL DATA:  Abnormal labs elevated white count low hemoglobin EXAM: PORTABLE CHEST 1 VIEW COMPARISON:  None. FINDINGS: No focal opacity or pleural effusion. Cardiomediastinal silhouette within normal limits. Aortic atherosclerosis. No pneumothorax. IMPRESSION: No active disease. Electronically Signed   By: Donavan Foil M.D.   On: 07/30/2020 20:10     CBC Recent Labs  Lab 07/30/20 1835 07/31/20 0612 08/01/20 0640 08/02/20 0845 08/03/20 0459  WBC 12.6* 11.4* 7.7 8.0 8.9  HGB 7.1* 8.3*  8.3* 7.3* 10.3* 10.4*  HCT 21.7* 24.7*  25.0* 22.7* 32.0* 32.2*  PLT 232 195 190 227 229  MCV 97.3 93.9 97.4 97.6 96.1  MCH 31.8 31.6 31.3 31.4 31.0  MCHC 32.7 33.6 32.2 32.2 32.3  RDW 15.3 16.7* 17.0* 16.5* 16.1*  LYMPHSABS 1.1  --   --   --   --   MONOABS 1.3*  --   --   --   --   EOSABS 0.0  --   --   --   --   BASOSABS 0.0  --   --   --   --     Chemistries  Recent Labs  Lab 07/30/20 1835 07/31/20 0612 08/01/20 0640 08/02/20 0453 08/02/20 0845  08/03/20 0459  NA 130* 132* 132*  --  136 136  K 4.8 4.1 3.8  --  3.6 3.9  CL 99 106 108  --  106 106  CO2 19* 16* 16*  --  20* 21*  GLUCOSE 106* 104* 101*  --  99 102*  BUN 89* 85* 71*  --  64* 63*  CREATININE 3.80* 3.42* 2.84* 2.85* 2.85* 2.51*  CALCIUM 8.8* 8.3* 8.3*  --  8.8* 8.8*  AST 45*  --  41  --   --   --   ALT 31  --  26  --   --   --   ALKPHOS 88  --  79  --   --   --   BILITOT 1.0  --  1.1  --   --   --    ------------------------------------------------------------------------------------------------------------------ No results for input(s): CHOL, HDL, LDLCALC, TRIG, CHOLHDL, LDLDIRECT in the last 72 hours.  No results found for: HGBA1C ------------------------------------------------------------------------------------------------------------------ No results for input(s): TSH, T4TOTAL, T3FREE, THYROIDAB in the last 72 hours.  Invalid input(s): FREET3 ------------------------------------------------------------------------------------------------------------------ No results for  input(s): VITAMINB12, FOLATE, FERRITIN, TIBC, IRON, RETICCTPCT in the last 72 hours.  Coagulation profile Recent Labs  Lab 07/31/20 1144  INR 1.5*    No results for input(s): DDIMER in the last 72 hours.  Cardiac Enzymes No results for input(s): CKMB, TROPONINI, MYOGLOBIN in the last 168 hours.  Invalid input(s): CK ------------------------------------------------------------------------------------------------------------------ No results found for: BNP   Roxan Hockey M.D on 08/03/2020 at 1:58 PM  Go to [email or web address] - for contact info  [facility] - Office  [phone]

## 2020-08-03 NOTE — TOC Progression Note (Signed)
Transition of Care [facility]) - Progression Note    Patient Details  Name: Noah Robinson MRN: [id number] Date of Birth: 1945-06-24  Transition [facility]) CM/SW Contact  Natasha Bence, LCSW Phone Number: 08/03/2020, 2:19 PM  Clinical Narrative:    Josem Kaufmann not received. TOC to follow.    Expected Discharge Plan: [place] Barriers to Discharge: Continued Medical Work up  Expected Discharge Plan and Services Expected Discharge Plan: [place] In-house Referral: Clinical Social Work   Post Acute Care Choice: [place] Living arrangements for the past 2 months: [place] Expected Discharge Date: 08/03/20               DME Arranged: N/A DME Agency: NA                   Social Determinants of Health (SDOH) Interventions    Readmission Risk Interventions No flowsheet data found.

## 2020-08-03 NOTE — Evaluation (Signed)
Physical Therapy Evaluation Patient Details Name: Noah Robinson MRN: [id number] DOB: 1945/03/19 Today's Date: 08/03/2020   History of Present Illness  75 y.o. male resident of the [facility], history of HIV with negative RNA (06/2020), hepatitis C status post treatment with negative HCVRNA (06/2020) cirrhosis, chronic kidney disease 3A, FTT/short term memory loss admitted on 07/30/20 with drop in hgb and Heme+ve stool  Clinical Impression  Patient asleep in room and difficult to arouse. 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 Patient demos some shoulder mobility on R, does not move L. Presents with increased difficulty with LE movement bilaterally. Patient appears to be at baseline per RN report. RN notified of patient's movements. Pt no longer requires skilled acute care physical therapy at this time and is discharged from acute care PT.     Follow Up Recommendations No PT follow up    Equipment Recommendations  None recommended by PT    Recommendations for Other Services       Precautions / Restrictions Precautions Precautions: Fall Restrictions Weight Bearing Restrictions: No      Mobility  Bed Mobility Overal bed mobility: Needs Assistance             General bed mobility comments: patient would not complete bed mobility with PT, RN reports that she was unable to roll patient without assistance.    Transfers Overall transfer level: Needs assistance               General transfer comment: patient would not complete bed mobility with PT, RN reports that she was unable to roll patient without assistance.  Ambulation/Gait                Stairs            Wheelchair Mobility    Modified Rankin (Stroke Patients Only)       Balance                                             Pertinent Vitals/Pain Pain  Assessment: No/denies pain    Home Living Family/patient expects to be discharged to:: Skilled nursing facility                 Additional Comments: [facility]    Prior Function Level of [place]: Needs assistance   Gait / Transfers Assistance Needed: baseline contractures in L side, non ambulatory           Hand Dominance        Extremity/Trunk Assessment   Upper Extremity Assessment Upper Extremity Assessment: Generalized weakness    Lower Extremity Assessment Lower Extremity Assessment: Generalized weakness       Communication      Cognition Arousal/Alertness: Lethargic Behavior During Therapy: Flat affect Overall Cognitive Status: Difficult to assess                                        General Comments      Exercises     Assessment/Plan    PT Assessment [place] does not need any further PT services  PT Problem List         PT Treatment Interventions  PT Goals (Current goals can be found in the Care Plan section)  Acute Rehab PT Goals Patient Stated Goal: return to [place] center PT Goal Formulation: With patient Time For Goal Achievement: 08/10/20 Potential to Achieve Goals: Good    Frequency     Barriers to discharge        Co-evaluation               AM-PAC PT "6 Clicks" Mobility  Outcome Measure Help needed turning from your back to your side while in a flat bed without using bedrails?: A Lot Help needed moving from lying on your back to sitting on the side of a flat bed without using bedrails?: A Lot Help needed moving to and from a bed to a chair (including a wheelchair)?: Total Help needed standing up from a chair using your arms (e.g., wheelchair or bedside chair)?: Total Help needed to walk in hospital room?: Total Help needed climbing 3-5 steps with a railing? : Total 6 Click Score: 8    End of Session   Activity Tolerance: Patient limited by lethargy Patient left: in bed;with bed  alarm set Nurse Communication: Mobility status PT Visit Diagnosis: Muscle weakness (generalized) (M62.81);Hemiplegia and hemiparesis Hemiplegia - Right/Left: Left    Time: 9471-2527 PT Time Calculation (min) (ACUTE ONLY): 8 min   Charges:   PT Evaluation $PT Eval Low Complexity: 1 Low          12:09 PM,08/03/20 Domenic Moras, PT, DPT Physical Therapist at [facility]

## 2020-08-03 NOTE — NC FL2 (Signed)
Diagonal LEVEL OF CARE SCREENING TOOL     IDENTIFICATION  Patient Name: Noah Robinson Birthdate: 1945-03-16 Sex: male Admission Date (Current Location): 07/30/2020  [facility] and [place] Number:  [employer] and Address:  [place] [address] [place]      Provider Number: [id number]  Attending Physician Name and Address:  Roxan Hockey, MD  Relative Name and Phone Number:  Ranae Plumber (daughter) Ph: [phone]    Current Level of Care: Hospital Recommended Level of Care: [place] Prior Approval Number:    Date Approved/Denied:   PASRR Number: [id number]  Discharge Plan: SNF    Current Diagnoses: Patient Active Problem List   Diagnosis Date Noted  . Heme positive stool   . Acute anemia 07/30/2020  . Abnormal gait 05/06/2020  . Iron deficiency anemia 05/06/2020  . Sleep disturbance 05/06/2020  . Itching 04/11/2020  . 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 Thrombocytopenia ([place]) 09/06/2019  . Gout   . HIV (human immunodeficiency virus infection) ([place])   . Hypertension   . Chronic kidney disease   . Hepatitis B core antibody positive   . H. pylori infection 07/27/2017  . Rectal bleeding 07/27/2017  . HIV disease ([place]) 05/02/2017  . Hepatic cirrhosis ([place]) 05/02/2017  . Anemia 03/03/2017  . Hepatitis C, chronic ([place]) 03/03/2017    Orientation RESPIRATION BLADDER Height & Weight     Self,Time,Situation,Place  Normal Continent Weight: 136 lb 11 oz (62 kg) Height:  5\' 9"  (175.3 cm)  BEHAVIORAL SYMPTOMS/MOOD NEUROLOGICAL BOWEL NUTRITION STATUS      Incontinent Diet  AMBULATORY STATUS COMMUNICATION OF NEEDS Skin   Limited Assist Verbally Other (Comment) (Blister: right heel; ecchymosis)                       Personal Care Assistance Level of Assistance  Bathing,Feeding,Dressing   Feeding assistance: Independent Dressing Assistance: Limited assistance     Functional  Limitations Info  Sight,Hearing,Speech     Speech Info: Adequate    SPECIAL CARE FACTORS FREQUENCY  PT (By licensed PT),OT (By licensed OT)     PT Frequency: 5x's/week OT Frequency: 5x's/week            Contractures Contractures Info: Not present    Additional Factors Info  Code Status,Allergies Code Status Info: Full Allergies Info: Pantoprazole           Current Medications (08/03/2020):  This is the current hospital active medication list Current Facility-Administered Medications  Medication Dose Route Frequency Provider Last Rate Last Admin  . acetaminophen (TYLENOL) tablet 650 mg  650 mg Oral Q6H PRN Emokpae, Ejiroghene E, MD   650 mg at 08/01/20 2124   Or  . acetaminophen (TYLENOL) suppository 650 mg  650 mg Rectal Q6H PRN Emokpae, Ejiroghene E, MD      . allopurinol (ZYLOPRIM) tablet 200 mg  200 mg Oral Daily Emokpae, Courage, MD   200 mg at 08/03/20 0955  . Chlorhexidine Gluconate Cloth 2 % PADS 6 each  6 each Topical Daily Roxan Hockey, MD   6 each at 08/03/20 0956  . dolutegravir (TIVICAY) tablet 50 mg  50 mg Oral Daily Emokpae, Courage, MD   50 mg at 08/03/20 0955  . famotidine (PEPCID) IVPB 20 mg premix  20 mg Intravenous Q24H Emokpae, Courage, MD 100 mL/hr at 08/02/20 2018 20 mg at 08/02/20 2018  . lamiVUDine (EPIVIR) 10 MG/ML solution 100 mg  100 mg Oral Daily Denton Brick, Courage, MD   100 mg at 08/03/20 0956  . Levofloxacin (LEVAQUIN) IVPB 250 mg  250 mg Intravenous Q48H Emokpae, Courage, MD 50 mL/hr at 08/02/20 1647 250 mg at 08/02/20 1647  . ondansetron (ZOFRAN) tablet 4 mg  4 mg Oral Q6H PRN Emokpae, Ejiroghene E, MD       Or  . ondansetron (ZOFRAN) injection 4 mg  4 mg Intravenous Q6H PRN Emokpae, Ejiroghene E, MD   4 mg at 08/01/20 0237  . polyethylene glycol (MIRALAX / GLYCOLAX) packet 17 g  17 g Oral Daily PRN Emokpae, Ejiroghene E, MD      . sucralfate (CARAFATE) tablet 1 g  1 g Oral QID Denton Brick, Courage, MD   1 g at 08/03/20 1256     Discharge  Medications: Please see discharge summary for a list of discharge medications.  Relevant Imaging Results:  Relevant Lab Results:   Additional Information SSN: [id number]  Natasha Bence, LCSW

## 2020-08-04 LAB — RESP PANEL BY RT-PCR (FLU A&B, COVID) ARPGX2
Influenza A by PCR: NEGATIVE
Influenza B by PCR: NEGATIVE
SARS Coronavirus 2 by RT PCR: NEGATIVE

## 2020-08-04 LAB — CULTURE, BLOOD (SINGLE)
Culture: NO GROWTH
Special Requests: ADEQUATE

## 2020-08-04 MED ORDER — SUCRALFATE 1 G PO TABS: 1.0000 g | ORAL_TABLET | Freq: Four times a day (QID) | ORAL | 0 refills | Status: AC

## 2020-08-04 MED ORDER — FAMOTIDINE 20 MG PO TABS: 20.0000 mg | ORAL_TABLET | Freq: Two times a day (BID) | ORAL | 3 refills | Status: AC

## 2020-08-04 MED ORDER — ACETAMINOPHEN 325 MG PO TABS: 650.0000 mg | ORAL_TABLET | Freq: Four times a day (QID) | ORAL | 0 refills | Status: AC | PRN

## 2020-08-04 MED ORDER — ONDANSETRON HCL 4 MG PO TABS: 4.0000 mg | ORAL_TABLET | Freq: Four times a day (QID) | ORAL | 0 refills | Status: AC | PRN

## 2020-08-04 MED ORDER — ALLOPURINOL 100 MG PO TABS: 200.0000 mg | ORAL_TABLET | Freq: Every day | ORAL | 5 refills | Status: AC

## 2020-08-04 MED ORDER — LEVOFLOXACIN 250 MG PO TABS: 250.0000 mg | ORAL_TABLET | ORAL | 0 refills | Status: AC

## 2020-08-04 NOTE — Plan of Care (Signed)
  Problem: Acute Rehab PT Goals(only PT should resolve) Goal: Pt Will Go Supine/Side To Sit Outcome: Progressing Flowsheets (Taken 08/04/2020 1145) Pt will go Supine/Side to Sit: with moderate assist Goal: Patient Will Transfer Sit To/From Stand Outcome: Progressing Flowsheets (Taken 08/04/2020 1145) Patient will transfer sit to/from stand:  with moderate assist  with maximum assist Goal: Pt Will Transfer Bed To Chair/Chair To Bed Outcome: Progressing Flowsheets (Taken 08/04/2020 1145) Pt will Transfer Bed to Chair/Chair to Bed:  with mod assist  with max assist Goal: Pt Will Ambulate Outcome: Progressing Flowsheets (Taken 08/04/2020 1145) Pt will Ambulate:  10 feet  with moderate assist  with cane Note: Hemi-walker or Quad cane   11:46 AM, 08/04/20 Lonell Grandchild, MPT Physical Therapist with [facility] [phone] office [phone] mobile phone

## 2020-08-04 NOTE — Evaluation (Signed)
Physical Therapy Evaluation Patient Details Name: Noah Robinson MRN: [id number] DOB: 31-Oct-1945 Today's Date: 08/04/2020   History of Present Illness  Noah Robinson is a 75 y.o. male with medical history significant for HIV, hepatitis C, and liver cirrhosis, CKD 3.  Patient was sent to the ED reports of low hemoglobin and elevated white count-hemoglobin of 7.4, white blood count of 11.2.  On my evaluation, patient denies abdominal pain, he denies vomiting blood, he denies blood in stools or black stools.  He denies pain with urination denies chest pain no cough no difficulty breathing.     Recent hospitalization at [facility] 5/14 - 5/27- for AKI on CKD, creatinine up to 8.69 on admission-thought secondary to post obstruction AKI and ATN.  UTI prostatitis E. coli and Enterococcus faecalis initially treated with Zosyn, transition to Levaquin to treat through 6/11 for 4 weeks for prostatitis.  He was also to follow-up with urology as he failed voiding trial, he was started on finasteride and tamsulosin.  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    Clinical Impression  Patient demonstrates slow labored movement for sitting up at bedside with c/o severe left knee pain with movement, non-functional use of LUE due to contractures.  Patient occasionally leans backwards while seated at bedside and unable to stand due to BLE weakness and left knee pain.  Patient put back to bed with Max 2 person assist to reposition.  Patient will benefit from continued physical therapy in hospital and recommended venue below to increase strength, balance, endurance for safe ADLs and gait.     Follow Up Recommendations SNF    Equipment Recommendations       Recommendations for Other Services       Precautions / Restrictions Precautions Precautions: Fall Restrictions Weight Bearing Restrictions: No      Mobility   Bed Mobility Overal bed mobility: Needs Assistance Bed Mobility: Supine to Sit;Sit to Supine       Sit to supine: Max assist   General bed mobility comments: slow labored movement, LUE non functional, c/o severe left knee pain with movement    Transfers                    Ambulation/Gait                Stairs            Wheelchair Mobility    Modified Rankin (Stroke Patients Only)       Balance Overall balance assessment: Needs assistance Sitting-balance support: Feet supported;No upper extremity supported Sitting balance-Leahy Scale: Fair Sitting balance - Comments: fair/poor seated at EOB                                     Pertinent Vitals/Pain Pain Assessment: Faces Faces Pain Scale: Hurts whole lot Pain Location: left knee with movement Pain Descriptors / Indicators: Grimacing;Guarding;Sore Pain Intervention(s): Limited activity within patient's tolerance;Monitored during session;Repositioned    Home Living Family/patient expects to be discharged to:: Private residence Living Arrangements: Alone Available Help at Discharge: Available PRN/intermittently Type of Home: House Home Access: Stairs to enter Entrance Stairs-Rails: Right Entrance Stairs-Number of Steps: 6 Home Layout: One level Home Equipment: Walker - 2 wheels;Cane - single point;Shower seat;Bedside commode      Prior Function Level of Independence: Independent with  assistive device(s)   Gait / Transfers Assistance Needed: Community ambulator using SPC, drives, "per patient"  ADL's / Homemaking Assistance Needed: assisted by family        Hand Dominance   Dominant Hand: Right    Extremity/Trunk Assessment   Upper Extremity Assessment Upper Extremity Assessment: Generalized weakness;LUE deficits/detail LUE Deficits / Details: grossly 1/5, unable to grip with left hand LUE: Unable to fully assess due to pain LUE Sensation: decreased light touch LUE  Coordination: decreased fine motor;decreased gross motor    Lower Extremity Assessment Lower Extremity Assessment: Generalized weakness;LLE deficits/detail LLE Deficits / Details: grossly 2/5 LLE: Unable to fully assess due to pain LLE Sensation: WNL LLE Coordination: decreased fine motor;decreased gross motor    Cervical / Trunk Assessment Cervical / Trunk Assessment: Normal  Communication   Communication: No difficulties  Cognition Arousal/Alertness: Awake/alert Behavior During Therapy: WFL for tasks assessed/performed Overall Cognitive Status: Within Functional Limits for tasks assessed                                        General Comments      Exercises     Assessment/Plan    PT Assessment Patient needs continued PT services  PT Problem List Decreased strength;Decreased activity tolerance;Decreased balance;Decreased mobility       PT Treatment Interventions DME instruction;Stair training;Functional mobility training;Therapeutic activities;Therapeutic exercise;Gait training;Balance training;Patient/family education    PT Goals (Current goals can be found in the Care Plan section)  Acute Rehab PT Goals Patient Stated Goal: return home after rehab PT Goal Formulation: With patient Time For Goal Achievement: 08/18/20 Potential to Achieve Goals: Fair    Frequency Min 3X/week   Barriers to discharge        Co-evaluation               AM-PAC PT "6 Clicks" Mobility  Outcome Measure Help needed turning from your back to your side while in a flat bed without using bedrails?: A Lot Help needed moving from lying on your back to sitting on the side of a flat bed without using bedrails?: A Lot Help needed moving to and from a bed to a chair (including a wheelchair)?: Total Help needed standing up from a chair using your arms (e.g., wheelchair or bedside chair)?: Total Help needed to walk in hospital room?: Total Help needed climbing 3-5 steps  with a railing? : Total 6 Click Score: 8    End of Session   Activity Tolerance: Patient tolerated treatment well;Patient limited by fatigue;Patient limited by pain Patient left: in bed;with call bell/phone within reach Nurse Communication: Mobility status PT Visit Diagnosis: Muscle weakness (generalized) (M62.81);Hemiplegia and hemiparesis Hemiplegia - Right/Left: Left Hemiplegia - dominant/non-dominant: Non-dominant    Time: 5284-1324 PT Time Calculation (min) (ACUTE ONLY): 25 min   Charges:   PT Evaluation $PT Eval Moderate Complexity: 1 Mod PT Treatments $Therapeutic Activity: 23-37 mins        11:44 AM, 08/04/20 Lonell Grandchild, MPT Physical Therapist with [facility] [phone] office [phone] mobile phone

## 2020-08-04 NOTE — Discharge Summary (Addendum)
Noah Robinson, is a 75 y.o. male  DOB [birth year]  MRN [id number].  Admission date:  07/30/2020  Admitting Physician  Bethena Roys, MD  Discharge Date:  08/04/2020   Primary MD  [facility]  Recommendations for primary care physician for things to follow:   1)Avoid ibuprofen/Advil/Aleve/Motrin/Goody Powders/Naproxen/BC powders/Meloxicam/Diclofenac/Indomethacin and other Nonsteroidal anti-inflammatory medications as these will make you more likely to bleed and can cause stomach ulcers, can also cause Kidney problems.   2)Follow-up Gastroenterologist Dr. Hurshel Keys with [facility]--- in 4 weeks for evaluation possible colonoscopy -address: [address], [place], [place] [address], Phone: [phone]  3) repeat CBC and BMP test on Friday 08/08/2020 advised  Admission Diagnosis  SIRS (systemic inflammatory response syndrome) ([place]) [R65.10] Symptomatic anemia [D64.9] Acute anemia [D64.9]   Discharge Diagnosis  SIRS (systemic inflammatory response syndrome) ([place]) [R65.10] Symptomatic anemia [D64.9] Acute anemia [D64.9]    Principal Problem:   Acute anemia Active Problems:   Hepatic cirrhosis ([place])   Chronic kidney disease   Hepatitis C, chronic (HCC)   HIV disease ([place])   Heme positive stool      Past Medical History:  Diagnosis Date  . Chronic kidney disease   . Cirrhosis ([place])   . Colon cancer ([place])    per patient recollection  . Enlarged prostate   . Gout   . HCV (hepatitis C virus)   . HIV (human immunodeficiency virus infection) ([place])    Dx 2019 approx, CD4 nadir 323.   Marland Kitchen Hyperlipidemia   . Hypertension     Past Surgical History:  Procedure Laterality Date  . BIOPSY  06/28/2017   Procedure: BIOPSY;  Surgeon: Danie Binder, MD;  [facility];  Service: Endoscopy;;  gastric bx's  . COLONOSCOPY     about 10 years  ago  . COLONOSCOPY WITH PROPOFOL N/A 06/28/2017   Procedure: COLONOSCOPY WITH PROPOFOL;  Surgeon: Danie Binder, MD;  [facility];  Service: Endoscopy;  Laterality: N/A;  9:15am  . ESOPHAGOGASTRODUODENOSCOPY (EGD) WITH PROPOFOL N/A 06/28/2017   Procedure: ESOPHAGOGASTRODUODENOSCOPY (EGD) WITH PROPOFOL;  Surgeon: Danie Binder, MD;  [facility];  Service: Endoscopy;  Laterality: N/A;  . POLYPECTOMY  06/28/2017   Procedure: POLYPECTOMY;  Surgeon: Danie Binder, MD;  [facility];  Service: Endoscopy;;  transverse colon polyps x2, descending colon polyp     HPI  from the history and physical done on the day of admission:    Chief Complaint: Low hemoglobin  HPI: Noah Robinson is a 75 y.o. male with medical history significant for HIV, hepatitis C, and liver cirrhosis, CKD 3. Patient was sent to the ED reports of low hemoglobin and elevated white count-hemoglobin of 7.4, white blood count of 11.2.  On my evaluation, patient denies abdominal pain, he denies vomiting blood, he denies blood in stools or black stools.  He denies pain with urination denies chest pain no cough no difficulty breathing.  Recent hospitalization at  [facility] 5/14 - 5/27- for AKI on CKD, creatinine up to 8.69 on admission-thought secondary to post obstruction AKI and ATN.  UTI prostatitis E. coli and Enterococcus faecalis initially treated with Zosyn, transition to Levaquin to treat through 6/11 for 4 weeks for prostatitis.  He was also to follow-up with urology as he failed voiding trial, he was started on finasteride and tamsulosin.  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  ED Course: T-max 100.3, tachycardic to 133, respiratory rate 18-22.  Blood pressure systolic 081K to 481E.  O2 sats greater than 98% on room air.  WBC 12.6.  Creatinine elevated at 3.8.  Sodium 130.  FOBT  positive.  Urine suggestive of UTI.  Chest x-ray unremarkable. EDP reports small streaks of blood on rectal exam, no hemorrhoids or fissures.  IV vancomycin and cefepime started.  1 unit PRBCs ordered for transfusion.  Hospitalist to admit.  Review of Systems: As per HPI all other systems reviewed and negative.      Hospital Course:     Brief Summary:- 75 [facility], history of HIV with negative RNA (06/2020), hepatitis Cstatus post treatment with negative HCVRNA (06/2020) cirrhosis, chronic kidney disease 3A, FTT/short term memory lossadmitted on 07/30/20 with drop in hgb and Heme+ve stool  A/p 1)Acute on chronic anemia due to ABLA--GI consult appreciated hgb is back up to 10.4 -was 7.1 on admission  -Patient did receive  transfusion of 1 unit of PRBC on 07/30/2020 -additional unit of PRBC on 08/01/2020 -c/n Pepcid as patient has PPI allergy -Carafate qid -Iron studies WNL -Patient had EGD and colonoscopy back in April 2019 -Repeat EGD on 08/01/2020-- with Grade I esophageal varices, - Mild Schatzki ring and Gastritis. -Tolerating oral intake, possible colonoscopy as outpatient  2) E. coli and Enterococcus UTI/Prostatitis----from lab work at [facility] was to treat patient for 4 weeks with Levaquin starting 07/26/2020 we will continue this thru 08/23/20 -WBC  12.6 >>11.4 >>8.9  3)Liver cirrhosis--- Levaquin as above in #2 we will also cover for SBP prophylaxis -Ammonia is not elevated  4)Hepatitis B/Hep C/HIV--- treated, HIV RNA and HCV RNA negative/not detectable on 07/15/2020  5)Sepsis secondary to #2 above-- POA--- patient met sepsis criteria on admission -Continue Levaquin as above #2  6)Prior Stroke with residual left-sided hemiparesis--- previous MRI with multiple microhemorrhages -Suspect HTN related  7)HTN- stop  lisinopril,   8)GOut--no acute flareup at this time --continue to hold  allopurinol  9)Generalized weakness/deconditioning/ambulatory dysfunction --- 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   Specialty: Infectious Diseases Why: 11:00 am appointment with Janene Madeira, NP  Contact information: [place], [place] [id number] [employer] [place] Follow up.   Contact information: [address] [place] [address] [phone]  Contact information for after-discharge care    [facility] .   Service: Skilled Nursing Contact information: [address] [place] [phone]                  Diet and Activity recommendation:  As advised  Discharge Instructions   Discharge Instructions    Call MD for:  difficulty breathing, headache or visual disturbances   Complete by: As directed    Call MD for:  persistant dizziness or light-headedness   Complete by: As directed    Call MD for:  persistant nausea and vomiting   Complete by: As directed    Call MD for:  temperature >100.4   Complete by: As directed    Diet - low sodium heart healthy   Complete by: As directed    Discharge instructions   Complete by: As directed    1)Avoid ibuprofen/Advil/Aleve/Motrin/Goody Powders/Naproxen/BC powders/Meloxicam/Diclofenac/Indomethacin and other Nonsteroidal anti-inflammatory  medications as these will make you more likely to bleed and can cause stomach ulcers, can also cause Kidney problems.   2)Follow-up Gastroenterologist Dr. Hurshel Keys with [facility]--- in 4 weeks for evaluation possible colonoscopy -address: [address], [place], [place] [address], Phone: [phone]  3) repeat CBC and BMP test on Friday 08/08/2020 advised   Increase activity slowly   Complete by: As directed         Discharge Medications     Allergies as of 08/04/2020      Reactions   Pantoprazole Hives      Medication List    STOP taking these medications   aspirin 81 MG chewable tablet   colchicine 0.6 MG tablet   lisinopril 5 MG tablet Commonly known as: ZESTRIL   traMADol 50 MG tablet Commonly known as: ULTRAM     TAKE these medications   acetaminophen 325 MG tablet Commonly known as: TYLENOL Take 2 tablets (650 mg total) by mouth every 6 (six) hours as needed for mild pain (or Fever >/= 101).   allopurinol 100 MG tablet Commonly known as: ZYLOPRIM Take 2 tablets (200 mg total) by mouth daily. Start taking on: August 05, 2020 What changed: how much to take   atorvastatin 80 MG tablet Commonly known as: LIPITOR Take 80 mg by mouth daily.   Biktarvy 50-200-25 MG Tabs tablet Generic drug: bictegravir-emtricitabine-tenofovir AF Take 1 tablet by mouth daily.   Calcium Acetate 667 MG Tabs Take 1 tablet by mouth in the morning, at noon, and at bedtime.   Epclusa 400-100 MG Tabs Generic drug: Sofosbuvir-Velpatasvir TAKE 1 TABLET BY MOUTH DAILY.   famotidine 20 MG tablet Commonly known as: PEPCID Take 1 tablet (20 mg total) by mouth 2 (two) times daily.   finasteride 5 MG tablet Commonly known as: PROSCAR Take 5 mg by mouth daily.   folic acid 1 MG tablet Commonly known as: FOLVITE Take 1 mg by mouth daily.   lamivudine 100 MG tablet Commonly known as: EPIVIR Take 100 mg by mouth every morning.   levofloxacin 250 MG  tablet Commonly known as: LEVAQUIN Take 1 tablet (250 mg total) by mouth every other day for 19 days. What changed: when to take this   mirtazapine 7.5 MG tablet Commonly known as: REMERON Take 7.5 mg by mouth at bedtime.   ondansetron 4 MG tablet Commonly known as: ZOFRAN Take 1 tablet (4 mg total) by mouth every 6 (six) hours as needed for nausea.   sevelamer  carbonate 800 MG tablet Commonly known as: RENVELA Take 800 mg by mouth 3 (three) times daily with meals.   sodium bicarbonate 650 MG tablet Take 650 mg by mouth 3 (three) times daily.   sucralfate 1 g tablet Commonly known as: CARAFATE Take 1 tablet (1 g total) by mouth 4 (four) times daily.   tamsulosin 0.4 MG Caps capsule Commonly known as: FLOMAX Take 0.4 mg by mouth daily.   thiamine 100 MG tablet Take 200 mg by mouth daily.   Tivicay 50 MG tablet Generic drug: dolutegravir Take 50 mg by mouth daily.   zinc sulfate 220 (50 Zn) MG capsule Take 220 mg by mouth daily.       Major procedures and Radiology Reports - PLEASE review detailed and final reports for all details, in brief -   DG Chest Port 1 View  Result Date: 07/30/2020 CLINICAL DATA:  Abnormal labs elevated white count low hemoglobin EXAM: PORTABLE CHEST 1 VIEW COMPARISON:  None. FINDINGS: No focal opacity or pleural effusion. Cardiomediastinal silhouette within normal limits. Aortic atherosclerosis. No pneumothorax. IMPRESSION: No active disease. Electronically Signed   By: Donavan Foil M.D.   On: 07/30/2020 20:10    Micro Results    Recent Results (from the past 240 hour(s))  Urine culture     Status: Abnormal   Collection Time: 07/30/20  7:52 PM   Specimen: In/Out Cath Urine  Result Value Ref Range Status   Specimen Description   Final    IN/OUT CATH URINE Performed at [facility], [address] [place], [place] [address]    Special Requests   Final    NONE Performed at [facility], [address] [place], [place] [address]     Culture >=100,000 COLONIES/mL YEAST (A)  Final   Report Status 08/01/2020 FINAL  Final  Blood culture (routine single)     Status: None   Collection Time: 07/30/20  8:24 PM   Specimen: BLOOD RIGHT HAND  Result Value Ref Range Status   Specimen Description BLOOD RIGHT HAND  Final   Special Requests   Final    BOTTLES DRAWN AEROBIC AND ANAEROBIC Blood Culture adequate volume   Culture   Final    NO GROWTH 5 DAYS Performed at [facility], [address] [place], [place] [address]    Report Status 08/04/2020 FINAL  Final  Resp Panel by RT-PCR (Flu A&B, Covid) Nasopharyngeal Swab     Status: None   Collection Time: 07/30/20  9:50 PM   Specimen: Nasopharyngeal Swab; Nasopharyngeal(NP) swabs in vial transport medium  Result Value Ref Range Status   SARS Coronavirus 2 by RT PCR NEGATIVE NEGATIVE Final    Comment: (NOTE) SARS-CoV-2 target nucleic acids are NOT DETECTED.  The SARS-CoV-2 RNA is generally detectable in upper respiratory specimens during the acute phase of infection. The lowest concentration of SARS-CoV-2 viral copies this assay can detect is 138 copies/mL. A negative result does not preclude SARS-Cov-2 infection and should not be used as the sole basis for treatment or other patient management decisions. A negative result may occur with  improper specimen collection/handling, submission of specimen other than nasopharyngeal swab, presence of viral mutation(s) within the areas targeted by this assay, and inadequate number of viral copies(<138 copies/mL). A negative result must be combined with clinical observations, patient history, and epidemiological information. The expected result is Negative.  Fact Sheet for Patients:  EntrepreneurPulse.com.au  Fact Sheet for Healthcare Providers:  IncredibleEmployment.be  This test is no t yet  approved or cleared by the [place] and  has been authorized for detection and/or diagnosis of  SARS-CoV-2 by FDA under an Emergency Use Authorization (EUA). This EUA will remain  in effect (meaning this test can be used) for the duration of the COVID-19 declaration under Section 564(b)(1) of the Act, 21 U.S.C.section 360bbb-3(b)(1), unless the authorization is terminated  or revoked sooner.       Influenza A by PCR NEGATIVE NEGATIVE Final   Influenza B by PCR NEGATIVE NEGATIVE Final    Comment: (NOTE) The Xpert Xpress SARS-CoV-2/FLU/RSV plus assay is intended as an aid in the diagnosis of influenza from Nasopharyngeal swab specimens and should not be used as a sole basis for treatment. Nasal washings and aspirates are unacceptable for Xpert Xpress SARS-CoV-2/FLU/RSV testing.  Fact Sheet for Patients: EntrepreneurPulse.com.au  Fact Sheet for Healthcare Providers: IncredibleEmployment.be  This test is not yet approved or cleared by the [place] FDA and has been authorized for detection and/or diagnosis of SARS-CoV-2 by FDA under an Emergency Use Authorization (EUA). This EUA will remain in effect (meaning this test can be used) for the duration of the COVID-19 declaration under Section 564(b)(1) of the Act, 21 U.S.C. section 360bbb-3(b)(1), unless the authorization is terminated or revoked.  Performed at [facility], [address], [place], [place] [address]     Today   Subjective    L[place] Karge today has no new complaints         -Oral intake is not great -No bleeding concerns  Patient has been seen and examined prior to discharge  -Patient was initially discharged by me on 08/04/2020, however transportation/EMS was unable to pick him up until the a.m. of 08/05/2020 -Patient reevaluated and seen by me on 08/05/2020 remains stable for discharge to SNF rehab -Please see full discharge summary dictated on dated 08/04/2020 which is currently being addended as of 08/05/2020   Objective   Blood pressure (!) 155/92, pulse (!) 110,  temperature 98.6 F (37 C), temperature source Oral, resp. rate 19, height '5\' 9"'  (1.753 m), weight 62 kg, SpO2 100 %.   Intake/Output Summary (Last 24 hours) at 08/04/2020 1613 Last data filed at 08/04/2020 1329 Gross per 24 hour  Intake 1455 ml  Output 750 ml  Net 705 ml    Exam Gen:- Awake Alert,  In no apparent distress, frail chronically ill and debilitated appearing HEENT:- [place].AT, No sclera icterus Neck-Supple Neck,No JVD,.  Lungs-  CTAB , fair symmetrical air movement CV- S1, S2 normal, regular  Abd-  +ve B.Sounds, Abd Soft, not distended, nontender  extremity/Skin:- No  edema, pedal pulses present  Psych-affect is appropriate, oriented x3 Neuro-residual left-sided hemiparesis, no new focal deficits, no tremors MSK- elbow and knee contractures at baseline- GU- Foley in situ   Data Review   CBC w Diff:  Lab Results  Component Value Date   WBC 8.9 08/03/2020   HGB 10.4 (L) 08/03/2020   HCT 32.2 (L) 08/03/2020   HCT 29.9 (L) 06/28/2017   PLT 229 08/03/2020   LYMPHOPCT 8 07/30/2020   MONOPCT 10 07/30/2020   EOSPCT 0 07/30/2020   BASOPCT 0 07/30/2020    CMP:  Lab Results  Component Value Date   NA 136 08/03/2020   K 3.9 08/03/2020   CL 106 08/03/2020   CO2 21 (L) 08/03/2020   BUN 63 (H) 08/03/2020   CREATININE 2.51 (H) 08/03/2020   CREATININE 1.78 (H) 04/10/2020   PROT 7.3 08/01/2020   ALBUMIN 2.0 (  L) 08/01/2020   BILITOT 1.1 08/01/2020   ALKPHOS 79 08/01/2020   AST 41 08/01/2020   ALT 26 08/01/2020   ALT 77 (H) 10/23/2019  . Total Discharge time is about 33 minutes  Roxan Hockey M.D on 08/04/2020 at 4:13 PM  Go to [email or web address] -  for contact info  [facility] - Office  [phone]

## 2020-08-04 NOTE — Telephone Encounter (Signed)
OV made, appt card mailed ?

## 2020-08-04 NOTE — Care Management Important Message (Signed)
Important Message  Patient Details  Name: Noah Robinson MRN: [id number] Date of Birth: 1946/02/14   Medicare Important Message Given:  Yes     Tommy Medal 08/04/2020, 1:26 PM

## 2020-08-04 NOTE — Discharge Instructions (Signed)
1)Avoid ibuprofen/Advil/Aleve/Motrin/Goody Powders/Naproxen/BC powders/Meloxicam/Diclofenac/Indomethacin and other Nonsteroidal anti-inflammatory medications as these will make you more likely to bleed and can cause stomach ulcers, can also cause Kidney problems.   2)Follow-up Gastroenterologist Dr. Hurshel Keys with [facility]--- in 4 weeks for evaluation possible colonoscopy -address: [address] [place], [place] [address], Phone: [phone]  3)Repeat CBC and BMP test on Friday 08/08/2020 advised

## 2020-08-04 NOTE — TOC Transition Note (Signed)
Transition of Care [facility]) - CM/SW Discharge Note   Patient Details  Name: Noah Robinson MRN: [id number] Date of Birth: 05-Nov-1945  Transition of [facility]) CM/SW Contact:  Boneta Lucks, RN Phone Number: 08/04/2020, 3:23 PM   Clinical Narrative:   Patient admitted from [facility] with acute anemia, patient had only been at rehab 4 days. PT's first note had no needs. Patient was a poor historian, sent to [facility] from another health care system. PT re-evaluated with SNF recommendation. Insurance Auth received, MD ordering a COVID test, RN to call report. Ebony Hail updated and ready for patient, Patient needs to be admitted by 7pm.    Final next level of care: [place] Barriers to Discharge: Barriers Resolved   Patient Goals and CMS Choice Patient states their goals for this hospitalization and ongoing recovery are:: Return to [facility] for rehab CMS Medicare.gov Compare Post Acute Care list provided to:: Patient Represenative (must comment) Choice offered to / list presented to : Adult Children  Discharge Placement              Patient chooses bed at:  [facility]) Patient to be transferred to facility by: EMS   Patient and family notified of of transfer: 08/04/20  Discharge Plan and Services In-house Referral: Clinical Social Work   Post Acute Care Choice: [place]          DME Arranged: N/A DME Agency: NA   Readmission Risk Interventions Readmission Risk Prevention Plan 08/04/2020  Transportation Screening Complete  PCP or Specialist Appt within 5-7 Days Complete  Home Care Screening Complete  Medication Review (RN CM) Complete  Some recent data might be hidden

## 2020-08-05 NOTE — Progress Notes (Signed)
Patient Demographics:    Noah Robinson, is a 75 y.o. male, DOB - Nov 30, 1945, CYE:[id number]  Admit date - 07/30/2020   Admitting Physician Ejiroghene Arlyce Dice, MD  Outpatient Primary MD for the patient is [facility]  LOS - 6   Chief Complaint  Patient presents with   Abnormal Lab        Subjective:    Noah Robinson today has no fevers, no emesis,  No chest pain,    -Patient was initially discharged by me on 08/04/2020, however transportation/EMS was unable to pick him up until the a.m. of 08/05/2020 -Patient reevaluated and seen by me on 08/05/2020 remains stable for discharge to SNF rehab -Please see full discharge summary dictated on dated 08/04/2020 which is currently being addended as of 08/05/2020   Assessment  & Plan :    Principal Problem:   Acute anemia Active Problems:   Hepatic cirrhosis ([place])   Chronic kidney disease   Hepatitis C, chronic (HCC)   HIV disease ([place])   Heme positive stool  Brief Summary:- 75 y.o. male resident of the [facility], history of HIV with negative RNA (06/2020), hepatitis C status post treatment with negative HCVRNA (06/2020) cirrhosis, chronic kidney disease 3A, FTT/short term memory loss admitted on 07/30/20 with drop in hgb and Heme+ve stool   A/p 1)Acute on chronic anemia due to ABLA--GI consult appreciated hgb is back up to 10.4 -was 7.1 on admission -Patient did receive  transfusion of 1 unit of PRBC on 07/30/2020 -additional unit of PRBC on 08/01/2020 -c/n Pepcid as patient has PPI allergy -Carafate qid -Iron studies WNL -Patient had EGD and colonoscopy back in April 2019 -Repeat EGD on 08/01/2020-- with Grade I esophageal varices,                           - Mild Schatzki ring and Gastritis. -Tolerating oral intake, possible colonoscopy as outpatient   2) E. coli and Enterococcus UTI/Prostatitis----from lab work at [facility]  was to treat patient for 4 weeks with Levaquin starting 07/26/2020 we will continue this thru 08/23/20 -WBC  12.6 >>11.4 >>8.9   3)Liver cirrhosis--- Levaquin as above in #2 we will also cover for SBP prophylaxis -Ammonia is not elevated   4)Hepatitis B/Hep C/HIV--- treated, HIV RNA and HCV RNA negative/not detectable on 07/15/2020   5)Sepsis secondary to #2 above-- POA--- patient met sepsis criteria on admission -Continue Levaquin as above #2   6)Prior Stroke with residual left-sided hemiparesis--- previous MRI with multiple microhemorrhages -Suspect HTN related   7)HTN- stop  lisinopril,   8)GOut--no acute flareup at this time --continue to hold allopurinol   9)Generalized weakness/deconditioning/ambulatory dysfunction --- 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----requires lots of help with mobility related ADLs -PT reevaluation appreciated recommends  skilled PT    Disposition/N--SNF Rehab  Disposition: The patient is from: SNF              Anticipated d/c is to: SNF              Code Status :  -  Code Status: Full Code   Family Communication:   (patient is alert, awake and coherent)  Consults  :  Gi  DVT Prophylaxis  :   - SCDs  SCDs Start: 07/31/20 0014   Lab Results  Component Value Date   PLT 229 08/03/2020    Inpatient Medications  Scheduled Meds:  allopurinol  200 mg Oral Daily   Chlorhexidine Gluconate Cloth  6 each Topical Daily   dolutegravir  50 mg Oral Daily   lamiVUDine  100 mg Oral Daily   sucralfate  1 g Oral QID   Continuous Infusions:  famotidine (PEPCID) IV 20 mg (08/04/20 2127)   levofloxacin (LEVAQUIN) IV 250 mg (08/04/20 1733)   PRN Meds:.acetaminophen **OR** acetaminophen, ondansetron **OR** ondansetron (ZOFRAN) IV, polyethylene glycol    Anti-infectives (From admission, onward)    Start     Dose/Rate Route Frequency Ordered Stop   08/04/20 0000  levofloxacin (LEVAQUIN) 250 MG tablet        250 mg Oral Every 48 hours  08/04/20 1601 08/23/20 2359   08/02/20 1800  Levofloxacin (LEVAQUIN) IVPB 250 mg        250 mg 50 mL/hr over 60 Minutes Intravenous Every 48 hours 07/31/20 1624 08/09/20 2359   08/01/20 2200  vancomycin (VANCOREADY) IVPB 750 mg/150 mL  Status:  Discontinued        750 mg 150 mL/hr over 60 Minutes Intravenous Every 48 hours 07/30/20 2113 07/31/20 1609   08/01/20 1015  lamiVUDine (EPIVIR) 10 MG/ML solution 100 mg        100 mg Oral Daily 08/01/20 0915     08/01/20 1000  dolutegravir (TIVICAY) tablet 50 mg        50 mg Oral Daily 08/01/20 0912     07/31/20 2200  ceFEPIme (MAXIPIME) 2 g in sodium chloride 0.9 % 100 mL IVPB  Status:  Discontinued        2 g 200 mL/hr over 30 Minutes Intravenous Every 24 hours 07/30/20 2100 07/31/20 1609   07/31/20 1800  Levofloxacin (LEVAQUIN) IVPB 250 mg  Status:  Discontinued        250 mg 50 mL/hr over 60 Minutes Intravenous Every 48 hours 07/31/20 1612 07/31/20 1624   07/31/20 1800  Levofloxacin (LEVAQUIN) IVPB 250 mg        250 mg 50 mL/hr over 60 Minutes Intravenous  Once 07/31/20 1625 08/01/20 1218   07/30/20 2100  ceFEPIme (MAXIPIME) 2 g in sodium chloride 0.9 % 100 mL IVPB        2 g 200 mL/hr over 30 Minutes Intravenous  Once 07/30/20 2049 07/30/20 2147   07/30/20 2100  metroNIDAZOLE (FLAGYL) IVPB 500 mg        500 mg 100 mL/hr over 60 Minutes Intravenous  Once 07/30/20 2049 07/30/20 2304   07/30/20 2100  vancomycin (VANCOCIN) IVPB 1000 mg/200 mL premix        1,000 mg 200 mL/hr over 60 Minutes Intravenous  Once 07/30/20 2049 07/31/20 0005         Objective:   Vitals:   08/04/20 0546 08/04/20 1329 08/04/20 2013 08/05/20 0516  BP: (!) 149/69 (!) 155/92 134/82 132/87  Pulse: (!) 109 (!) 110 (!) 108 98  Resp: '20 19 20 18  ' Temp: 98.4 F (36.9 C) 98.6 F (37 C) 98.7 F (37.1 C) 98.2 F (36.8 C)  TempSrc: Oral Oral Axillary Oral  SpO2: 100% 100% 100% 100%  Weight:      Height:        Wt Readings from Last 3 Encounters:  07/30/20 62  kg  05/06/20 72.7  kg  04/10/20 67.6 kg     Intake/Output Summary (Last 24 hours) at 08/05/2020 0819 Last data filed at 08/05/2020 0500 Gross per 24 hour  Intake 1575 ml  Output 900 ml  Net 675 ml   Physical Exam  Gen:- Awake Alert,  In no apparent distress  HEENT:- Zeb.AT, No sclera icterus Neck-Supple Neck,No JVD,.  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- GU- Foley in situ   Data Review:   Micro Results Recent Results (from the past 240 hour(s))  Urine culture     Status: Abnormal   Collection Time: 07/30/20  7:52 PM   Specimen: In/Out Cath Urine  Result Value Ref Range Status   Specimen Description   Final    IN/OUT CATH URINE Performed at [facility], [address] [place], [place] [address]    Special Requests   Final    NONE Performed at [facility], [address] [place], [place] [address]    Culture >=100,000 COLONIES/mL YEAST (A)  Final   Report Status 08/01/2020 FINAL  Final  Blood culture (routine single)     Status: None   Collection Time: 07/30/20  8:24 PM   Specimen: BLOOD RIGHT HAND  Result Value Ref Range Status   Specimen Description BLOOD RIGHT HAND  Final   Special Requests   Final    BOTTLES DRAWN AEROBIC AND ANAEROBIC Blood Culture adequate volume   Culture   Final    NO GROWTH 5 DAYS Performed at [facility], [address] [place],  [address]    Report Status 08/04/2020 FINAL  Final  Resp Panel by RT-PCR (Flu A&B, Covid) Nasopharyngeal Swab     Status: None   Collection Time: 07/30/20  9:50 PM   Specimen: Nasopharyngeal Swab; Nasopharyngeal(NP) swabs in vial transport medium  Result  Value Ref Range Status   SARS Coronavirus 2 by RT PCR NEGATIVE NEGATIVE Final    Comment: (NOTE) SARS-CoV-2 target nucleic acids are NOT DETECTED.  The SARS-CoV-2 RNA is generally detectable in upper respiratory specimens during the acute phase of infection. The lowest concentration of SARS-CoV-2 viral copies this assay can detect is 138 copies/mL. A negative result does not preclude SARS-Cov-2 infection and should not be used as the sole basis for treatment or other patient management decisions. A negative result may occur with  improper specimen collection/handling, submission of specimen other than nasopharyngeal swab, presence of viral mutation(s) within the areas targeted by this assay, and inadequate number of viral copies(<138 copies/mL). A negative result must be combined with clinical observations, patient history, and epidemiological information. The expected result is Negative.  Fact Sheet for Patients:  EntrepreneurPulse.com.au  Fact Sheet for Healthcare Providers:  IncredibleEmployment.be  This test is no t yet approved or cleared by the [place] FDA and  has been authorized for detection and/or diagnosis of SARS-CoV-2 by FDA under an Emergency Use Authorization (EUA). This EUA will remain  in effect (meaning this test can be used) for the duration of the COVID-19 declaration under Section 564(b)(1) of the Act, 21 U.S.C.section 360bbb-3(b)(1), unless the authorization is terminated  or revoked sooner.       Influenza A by PCR NEGATIVE NEGATIVE Final  Influenza B by PCR NEGATIVE NEGATIVE Final    Comment: (NOTE) The Xpert Xpress SARS-CoV-2/FLU/RSV plus assay is intended as an aid in the diagnosis of influenza from Nasopharyngeal swab specimens and should not be used as a sole basis for treatment. Nasal washings and aspirates are unacceptable for Xpert Xpress SARS-CoV-2/FLU/RSV testing.  Fact Sheet for  Patients: EntrepreneurPulse.com.au  Fact Sheet for Healthcare Providers: IncredibleEmployment.be  This test is not yet approved or cleared by the [place] FDA and has been authorized for detection and/or diagnosis of SARS-CoV-2 by FDA under an Emergency Use Authorization (EUA). This EUA will remain in effect (meaning this test can be used) for the duration of the COVID-19 declaration under Section 564(b)(1) of the Act, 21 U.S.C. section 360bbb-3(b)(1), unless the authorization is terminated or revoked.  Performed at [facility], [address], [place], [place] [address]   Resp Panel by RT-PCR (Flu A&B, Covid) Nasopharyngeal Swab     Status: None   Collection Time: 08/04/20  4:07 PM   Specimen: Nasopharyngeal Swab; Nasopharyngeal(NP) swabs in vial transport medium  Result Value Ref Range Status   SARS Coronavirus 2 by RT PCR NEGATIVE NEGATIVE Final    Comment: (NOTE) SARS-CoV-2 target nucleic acids are NOT DETECTED.  The SARS-CoV-2 RNA is generally detectable in upper respiratory specimens during the acute phase of infection. The lowest concentration of SARS-CoV-2 viral copies this assay can detect is 138 copies/mL. A negative result does not preclude SARS-Cov-2 infection and should not be used as the sole basis for treatment or other patient management decisions. A negative result may occur with  improper specimen collection/handling, submission of specimen other than nasopharyngeal swab, presence of viral mutation(s) within the areas targeted by this assay, and inadequate number of viral copies(<138 copies/mL). A negative result must be combined with clinical observations, patient history, and epidemiological information. The expected result is Negative.  Fact Sheet for Patients:  EntrepreneurPulse.com.au  Fact Sheet for Healthcare Providers:  IncredibleEmployment.be  This test is no t yet approved  or cleared by the [place] FDA and  has been authorized for detection and/or diagnosis of SARS-CoV-2 by FDA under an Emergency Use Authorization (EUA). This EUA will remain  in effect (meaning this test can be used) for the duration of the COVID-19 declaration under Section 564(b)(1) of the Act, 21 U.S.C.section 360bbb-3(b)(1), unless the authorization is terminated  or revoked sooner.       Influenza A by PCR NEGATIVE NEGATIVE Final   Influenza B by PCR NEGATIVE NEGATIVE Final    Comment: (NOTE) The Xpert Xpress SARS-CoV-2/FLU/RSV plus assay is intended as an aid in the diagnosis of influenza from Nasopharyngeal swab specimens and should not be used as a sole basis for treatment. Nasal washings and aspirates are unacceptable for Xpert Xpress SARS-CoV-2/FLU/RSV testing.  Fact Sheet for Patients: EntrepreneurPulse.com.au  Fact Sheet for Healthcare Providers: IncredibleEmployment.be  This test is not yet approved or cleared by the [place] FDA and has been authorized for detection and/or diagnosis of SARS-CoV-2 by FDA under an Emergency Use Authorization (EUA). This EUA will remain in effect (meaning this test can be used) for the duration of the COVID-19 declaration under Section 564(b)(1) of the Act, 21 U.S.C. section 360bbb-3(b)(1), unless the authorization is terminated or revoked.  Performed at [facility], [address], [place], [place] [address]     Radiology Reports DG Chest [place] 1 View  Result Date: 07/30/2020 CLINICAL DATA:  Abnormal labs elevated white count low hemoglobin EXAM: PORTABLE CHEST  1 VIEW COMPARISON:  None. FINDINGS: No focal opacity or pleural effusion. Cardiomediastinal silhouette within normal limits. Aortic atherosclerosis. No pneumothorax. IMPRESSION: No active disease. Electronically Signed   By: Donavan Foil M.D.   On: 07/30/2020 20:10     CBC Recent Labs  Lab 07/30/20 1835 07/31/20 0612  08/01/20 0640 08/02/20 0845 08/03/20 0459  WBC 12.6* 11.4* 7.7 8.0 8.9  HGB 7.1* 8.3*  8.3* 7.3* 10.3* 10.4*  HCT 21.7* 24.7*  25.0* 22.7* 32.0* 32.2*  PLT 232 195 190 227 229  MCV 97.3 93.9 97.4 97.6 96.1  MCH 31.8 31.6 31.3 31.4 31.0  MCHC 32.7 33.6 32.2 32.2 32.3  RDW 15.3 16.7* 17.0* 16.5* 16.1*  LYMPHSABS 1.1  --   --   --   --   MONOABS 1.3*  --   --   --   --   EOSABS 0.0  --   --   --   --   BASOSABS 0.0  --   --   --   --     Chemistries  Recent Labs  Lab 07/30/20 1835 07/31/20 0612 08/01/20 0640 08/02/20 0453 08/02/20 0845 08/03/20 0459  NA 130* 132* 132*  --  136 136  K 4.8 4.1 3.8  --  3.6 3.9  CL 99 106 108  --  106 106  CO2 19* 16* 16*  --  20* 21*  GLUCOSE 106* 104* 101*  --  99 102*  BUN 89* 85* 71*  --  64* 63*  CREATININE 3.80* 3.42* 2.84* 2.85* 2.85* 2.51*  CALCIUM 8.8* 8.3* 8.3*  --  8.8* 8.8*  AST 45*  --  41  --   --   --   ALT 31  --  26  --   --   --   ALKPHOS 88  --  79  --   --   --   BILITOT 1.0  --  1.1  --   --   --    ------------------------------------------------------------------------------------------------------------------ No results for input(s): CHOL, HDL, LDLCALC, TRIG, CHOLHDL, LDLDIRECT in the last 72 hours.  No results found for: HGBA1C ------------------------------------------------------------------------------------------------------------------ No results for input(s): TSH, T4TOTAL, T3FREE, THYROIDAB in the last 72 hours.  Invalid input(s): FREET3 ------------------------------------------------------------------------------------------------------------------ No results for input(s): VITAMINB12, FOLATE, FERRITIN, TIBC, IRON, RETICCTPCT in the last 72 hours.  Coagulation profile Recent Labs  Lab 07/31/20 1144  INR 1.5*    No results for input(s): DDIMER in the last 72 hours.  Cardiac Enzymes No results for input(s): CKMB, TROPONINI, MYOGLOBIN in the last 168 hours.  Invalid input(s):  CK ------------------------------------------------------------------------------------------------------------------ No results found for: BNP  Patient was initially discharged by me on 08/04/2020, however transportation/EMS was unable to pick him up until the a.m. of 08/05/2020 -Patient reevaluated and seen by me on 08/05/2020 remains stable for discharge to SNF rehab -Please see full discharge summary dictated on dated 08/04/2020 which is currently being addended as of 08/05/2020  Roxan Hockey M.D on 08/05/2020 at 8:19 AM  Go to [email or web address] - for contact info  [facility] - Office  [phone]

## 2020-08-05 NOTE — Progress Notes (Signed)
Patient discharged to [facility]  [place]. Report called and given to [facility]. Transported by EMS of [facility] to awaiting facility.

## 2020-08-05 NOTE — Progress Notes (Signed)
Nsg Discharge Note  Admit Date:  07/30/2020 Discharge date: 08/05/2020   Noah Robinson to be D/C'd Skilled nursing facility per MD order.  AVS completed.  Copy for chart, and copy for patient signed, and dated. Patient/caregiver able to verbalize understanding.  Discharge Medication: Allergies as of 08/05/2020      Reactions   Pantoprazole Hives      Medication List    STOP taking these medications   aspirin 81 MG chewable tablet   colchicine 0.6 MG tablet   lisinopril 5 MG tablet Commonly known as: ZESTRIL   traMADol 50 MG tablet Commonly known as: ULTRAM     TAKE these medications   acetaminophen 325 MG tablet Commonly known as: TYLENOL Take 2 tablets (650 mg total) by mouth every 6 (six) hours as needed for mild pain (or Fever >/= 101).   allopurinol 100 MG tablet Commonly known as: ZYLOPRIM Take 2 tablets (200 mg total) by mouth daily. What changed: how much to take   atorvastatin 80 MG tablet Commonly known as: LIPITOR Take 80 mg by mouth daily.   Biktarvy 50-200-25 MG Tabs tablet Generic drug: bictegravir-emtricitabine-tenofovir AF Take 1 tablet by mouth daily.   Calcium Acetate 667 MG Tabs Take 1 tablet by mouth in the morning, at noon, and at bedtime.   Epclusa 400-100 MG Tabs Generic drug: Sofosbuvir-Velpatasvir TAKE 1 TABLET BY MOUTH DAILY.   famotidine 20 MG tablet Commonly known as: PEPCID Take 1 tablet (20 mg total) by mouth 2 (two) times daily.   finasteride 5 MG tablet Commonly known as: PROSCAR Take 5 mg by mouth daily.   folic acid 1 MG tablet Commonly known as: FOLVITE Take 1 mg by mouth daily.   lamivudine 100 MG tablet Commonly known as: EPIVIR Take 100 mg by mouth every morning.   levofloxacin 250 MG tablet Commonly known as: LEVAQUIN Take 1 tablet (250 mg total) by mouth every other day for 19 days. What changed: when to take this   mirtazapine 7.5 MG tablet Commonly known as: REMERON Take 7.5 mg by mouth at bedtime.    ondansetron 4 MG tablet Commonly known as: ZOFRAN Take 1 tablet (4 mg total) by mouth every 6 (six) hours as needed for nausea.   sevelamer carbonate 800 MG tablet Commonly known as: RENVELA Take 800 mg by mouth 3 (three) times daily with meals.   sodium bicarbonate 650 MG tablet Take 650 mg by mouth 3 (three) times daily.   sucralfate 1 g tablet Commonly known as: CARAFATE Take 1 tablet (1 g total) by mouth 4 (four) times daily.   tamsulosin 0.4 MG Caps capsule Commonly known as: FLOMAX Take 0.4 mg by mouth daily.   thiamine 100 MG tablet Take 200 mg by mouth daily.   Tivicay 50 MG tablet Generic drug: dolutegravir Take 50 mg by mouth daily.   zinc sulfate 220 (50 Zn) MG capsule Take 220 mg by mouth daily.       Discharge Assessment: Vitals:   08/04/20 2013 08/05/20 0516  BP: 134/82 132/87  Pulse: (!) 108 98  Resp: 20 18  Temp: 98.7 F (37.1 C) 98.2 F (36.8 C)  SpO2: 100% 100%   Skin clean, dry and intact without evidence of skin break down, no evidence of skin tears noted. IV catheter discontinued intact. Site without signs and symptoms of complications - no redness or edema noted at insertion site, patient denies c/o pain - only slight tenderness at site.  Dressing with slight pressure  applied.  D/c Instructions-Education: Discharge instructions given to patient/family with verbalized understanding. D/c education completed with patient/family including follow up instructions, medication list, d/c activities limitations if indicated, with other d/c instructions as indicated by MD - patient able to verbalize understanding, all questions fully answered. Patient instructed to return to ED, call 911, or call MD for any changes in condition.  Patient escorted via [place], and D/C home via private auto.  Dorcas Mcmurray, LPN 04/10/5619 3:08 PM

## 2020-08-07 ENCOUNTER — Encounter (HOSPITAL_COMMUNITY): Payer: Self-pay | Admitting: Internal Medicine

## 2020-08-29 DEATH — deceased

## 2020-09-03 ENCOUNTER — Telehealth: Payer: Self-pay | Admitting: *Deleted

## 2020-09-04 NOTE — Telephone Encounter (Signed)
Received notification from patient's daughter that Mr Wanat passed away 09/15/20. Iver Nestle found online confirms he passed away that day at the [facility] facility. Provider and [facility] notified. Landis Gandy, RN

## 2020-09-05 ENCOUNTER — Inpatient Hospital Stay: Payer: Medicare Other | Admitting: Infectious Diseases

## 2020-12-12 ENCOUNTER — Ambulatory Visit: Payer: Medicare Other | Admitting: Gastroenterology

## 2021-10-28 IMAGING — US US ABDOMEN LIMITED W/ ELASTOGRAPHY
2 series · 12 of 25 positions shown · non-contrast
Comparison: 11/08/2016

CLINICAL DATA: Chronic hepatitis C

EXAM:
US ABDOMEN LIMITED - RIGHT UPPER QUADRANT
ULTRASOUND HEPATIC ELASTOGRAPHY
TECHNIQUE: Sonography of the right upper quadrant was performed. In addition,
ultrasound elastography evaluation of the liver was performed. A
region of interest was placed within the right lobe of the liver.
Following application of a compressive sonographic pulse, tissue
compressibility was assessed. Multiple assessments were performed at
the selected site. Median tissue compressibility was determined.
Previously, hepatic stiffness was assessed by shear wave velocity.
Based on recently published [employer] in [employer]
consensus article, reporting is now recommended to be performed in
the SI units of pressure (kiloPascals) representing hepatic
stiffness/elasticity. The obtained result is compared to the
published reference standards. (cACLD = compensated Advanced Chronic
Liver Disease)

[Series 1: us abdomen ruq w/elastography · 9 of 54 slices shown]
[im 4/54]
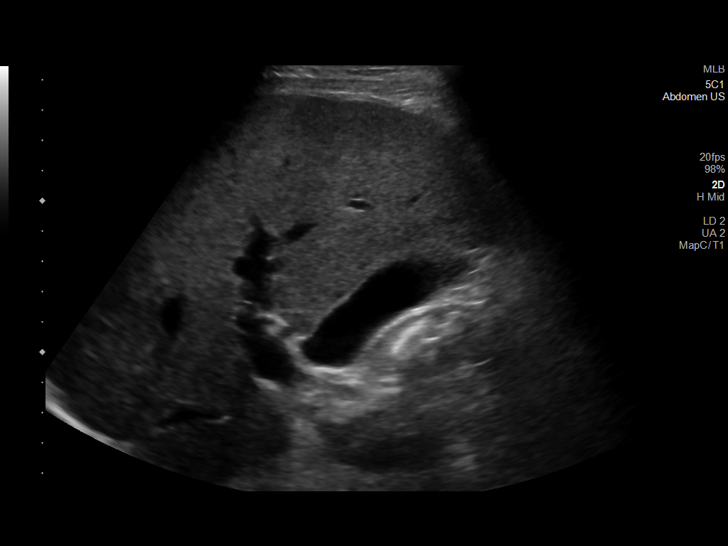
[im 10/54]
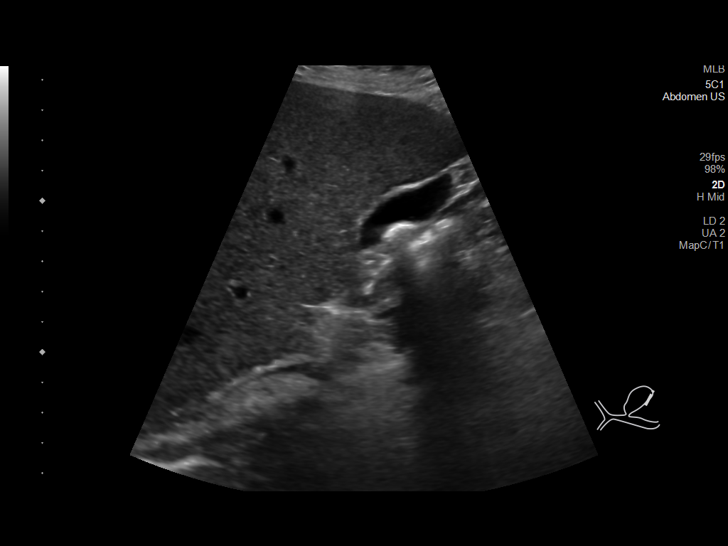
[im 16/54]
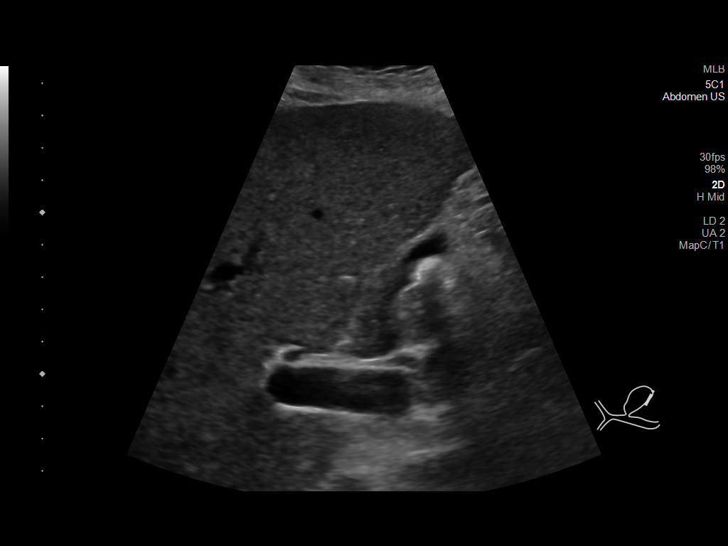
[im 22/54]
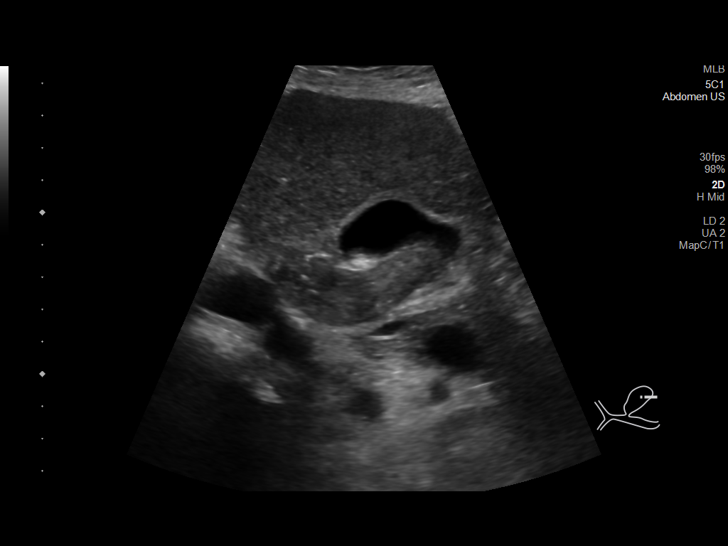
[im 29/54]
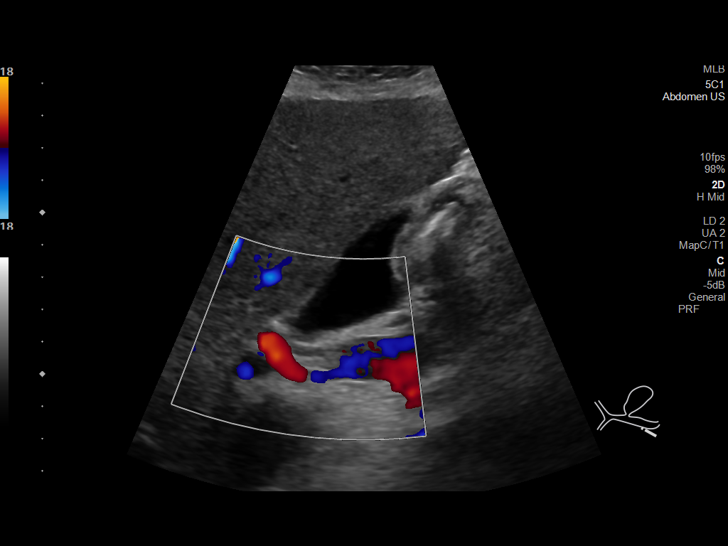
[im 35/54]
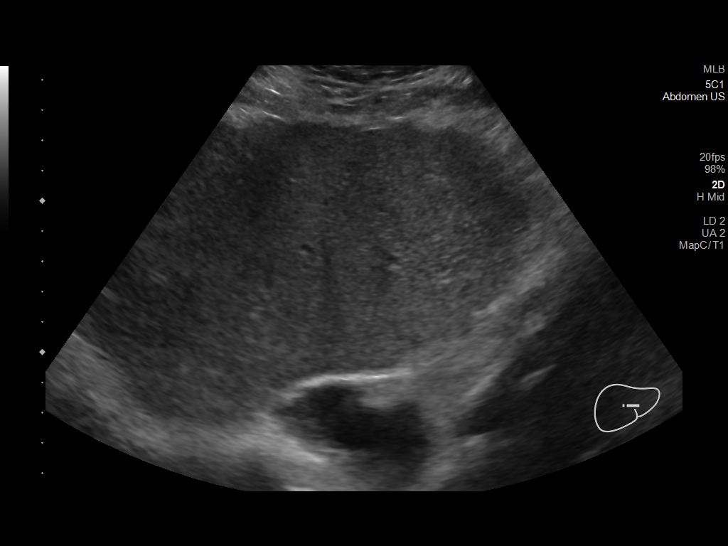
[im 41/54]
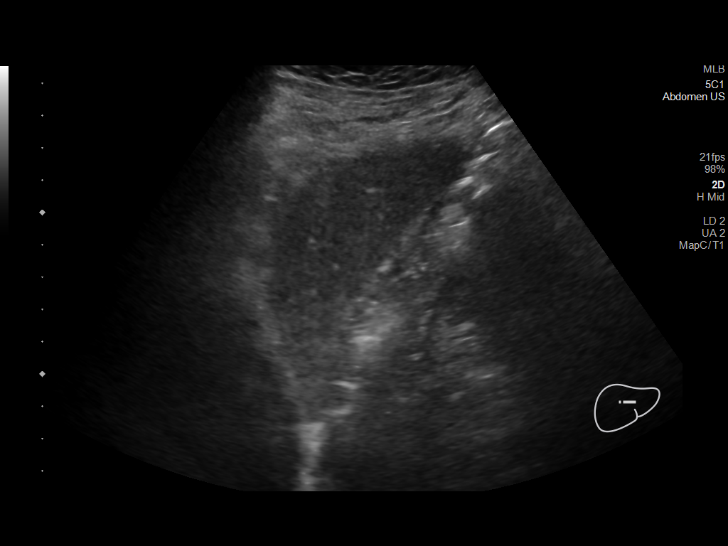
[im 47/54]
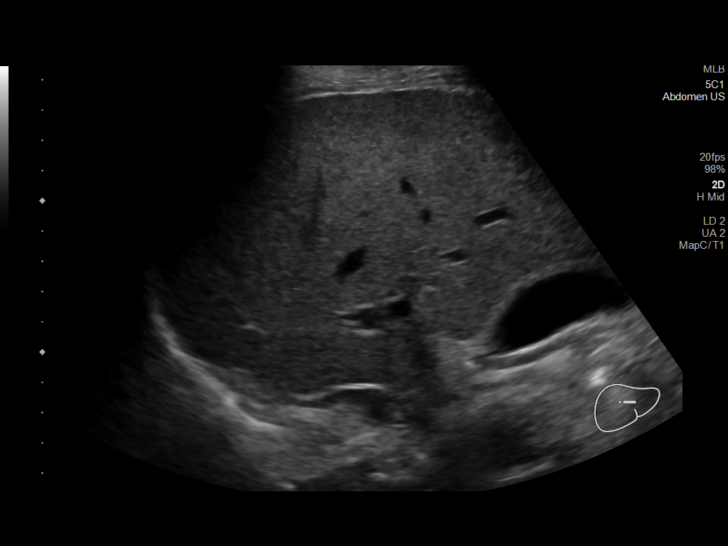
[im 54/54]
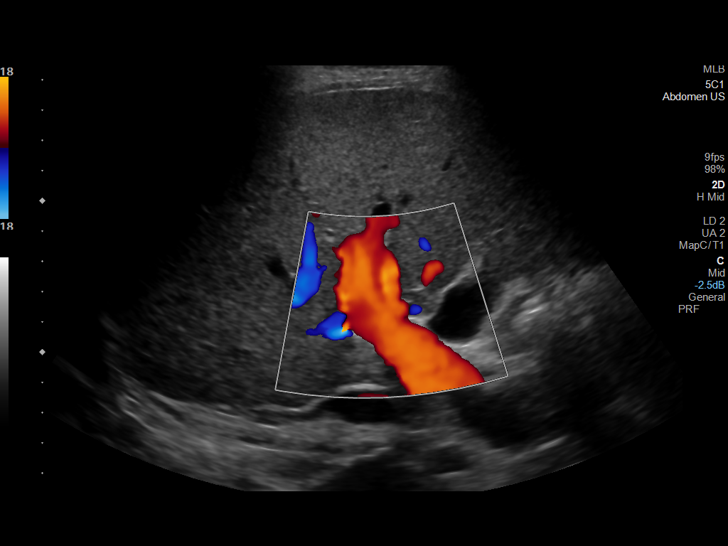

[Series 1001: abdomen us · 3 of 19 slices shown]
[im 4/19]
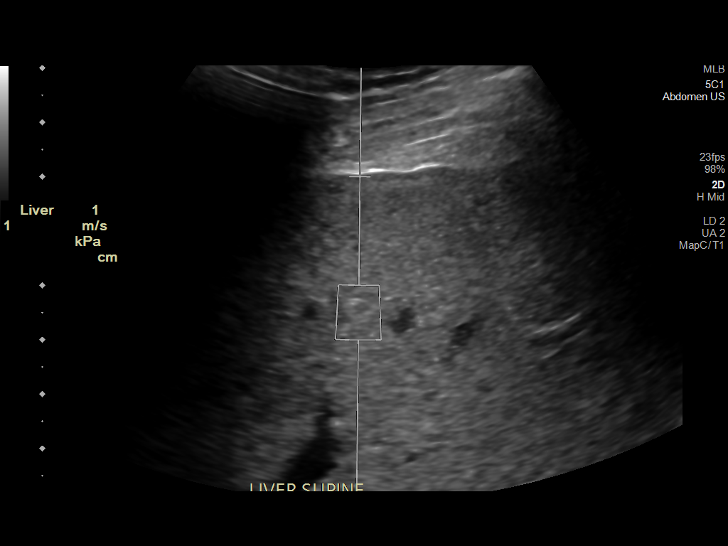
[im 10/19]
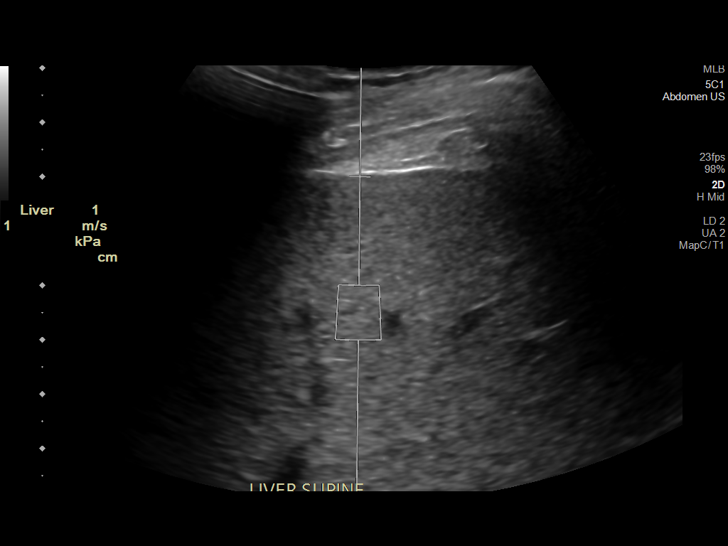
[im 16/19]
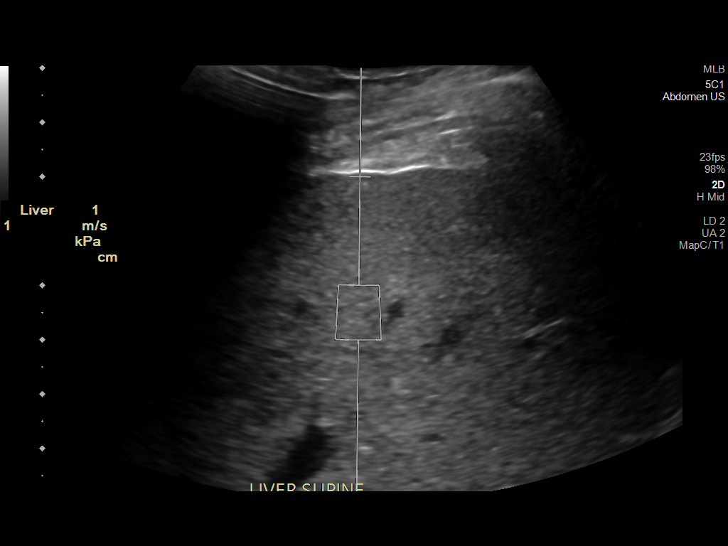

[12 of 25 positions shown; findings below may reference images not displayed]

FINDINGS: ULTRASOUND ABDOMEN LIMITED RIGHT UPPER QUADRANT

Gallbladder:

Gallstones. No gallbladder wall thickening. No sonographic Murphy
sign noted.

Common bile duct:

Diameter: 3 mm

Liver:

No focal lesion identified. Coarse, nodular texture of the liver
with normal background echogenicity. Portal vein is patent on color
Doppler imaging with normal direction of blood flow towards the
liver.

ULTRASOUND HEPATIC ELASTOGRAPHY

Device: Siemens Helix VTQ

Patient position: Supine

Transducer [id number]

Number of measurements: 10

Hepatic segment:  8

Median kPa:

IQR:

IQR/Median kPa ratio:

Data quality:  Good

Diagnostic category: > or =17 kPa: highly suggestive of cACLD with
an increased probability of clinically significant portal
hypertension
IMPRESSION: ULTRASOUND RUQ:

1. Coarse, nodular texture of the hepatic parenchyma without focal
liver lesion. Please note that multiphasic contrast enhanced MRI is
the most sensitive test for the screening detection of
hepatocellular carcinoma in the high risk setting of cirrhosis.

2.  Cholelithiasis.

ULTRASOUND HEPATIC ELASTOGRAPHY:

Median kPa:

Diagnostic category: > or =17 kPa: highly suggestive of cACLD with
an increased probability of clinically significant portal
hypertension

The use of hepatic elastography is applicable to patients with viral
hepatitis and non-alcoholic fatty liver disease. At this time, there
is insufficient data for the referenced cut-off values and use in
other causes of liver disease, including alcoholic liver disease.
Patients, however, may be assessed by elastography and serve as
their own reference standard/baseline.

In patients with non-alcoholic liver disease, the values suggesting
compensated advanced chronic liver disease (cACLD) may be lower, and
patients may need additional testing with elasticity results of [DATE]
kPa.

Please note that abnormal hepatic elasticity and shear wave
velocities may also be identified in clinical settings other than
with hepatic fibrosis, such as: acute hepatitis, elevated right
heart and central venous pressures including use of beta blockers,
Albrecht disease (Ceci), infiltrative processes such as
mastocytosis/amyloidosis/infiltrative tumor/lymphoma, extrahepatic
cholestasis, with hyperemia in the post-prandial state, and with
liver transplantation. Correlation with patient history, laboratory
data, and clinical condition recommended.

Diagnostic Categories:

< or =5 kPa: high probability of being normal

< or =9 kPa: in the absence of other known clinical signs, rules [DATE] kPa and ?13 kPa: suggestive of cACLD, but needs further testing

>13 kPa: highly suggestive of cACLD

> or =17 kPa: highly suggestive of cACLD with an increased
probability of clinically significant portal hypertension

## 2022-07-24 IMAGING — US US ABDOMEN LIMITED RUQ/ASCITES
1 series · 14 of 25 positions shown · non-contrast
Comparison: October 08, 2019

CLINICAL DATA: Hepatitis C.

EXAM:
ULTRASOUND ABDOMEN LIMITED RIGHT UPPER QUADRANT

[Series 1: us abdomen limited ruq/ascites · 0.20mm/px · 14 of 44 slices shown]
[im 1/44]
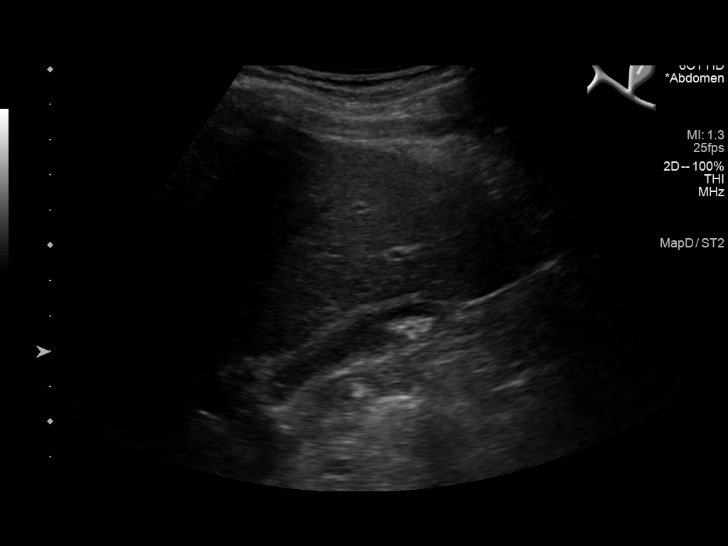
[im 4/44]
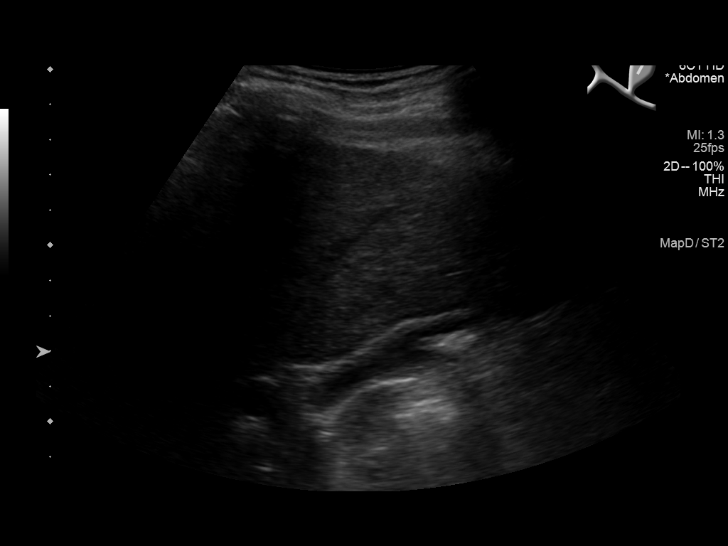
[im 8/44]
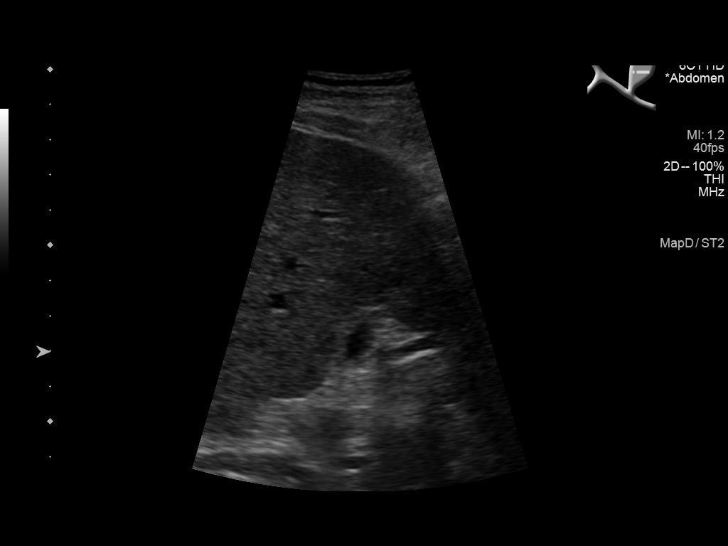
[im 11/44]
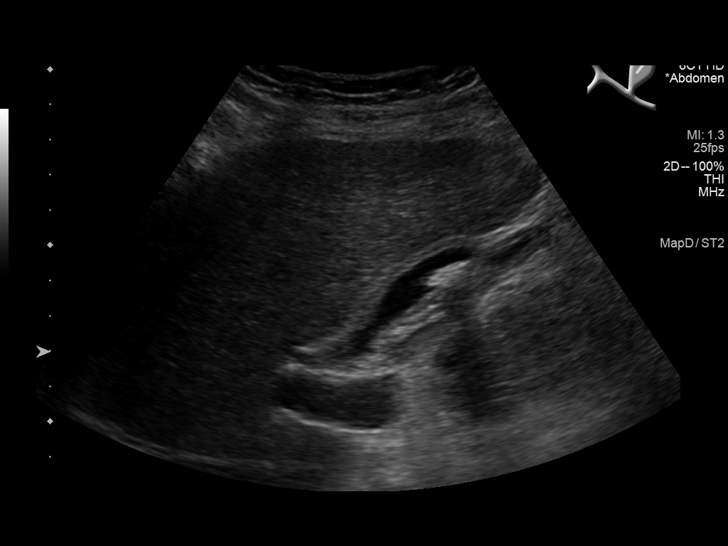
[im 15/44]
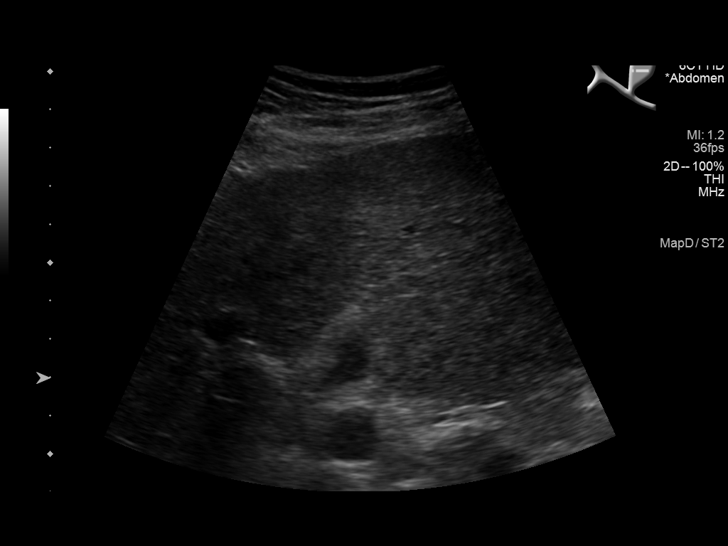
[im 17/44]
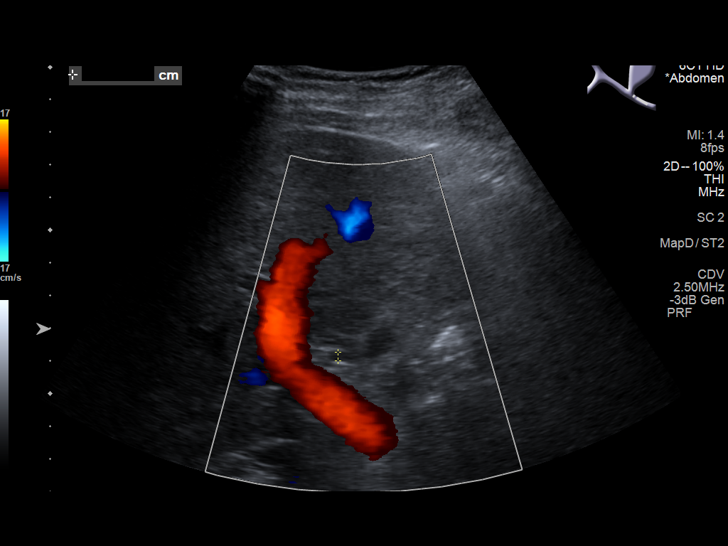
[im 20/44]
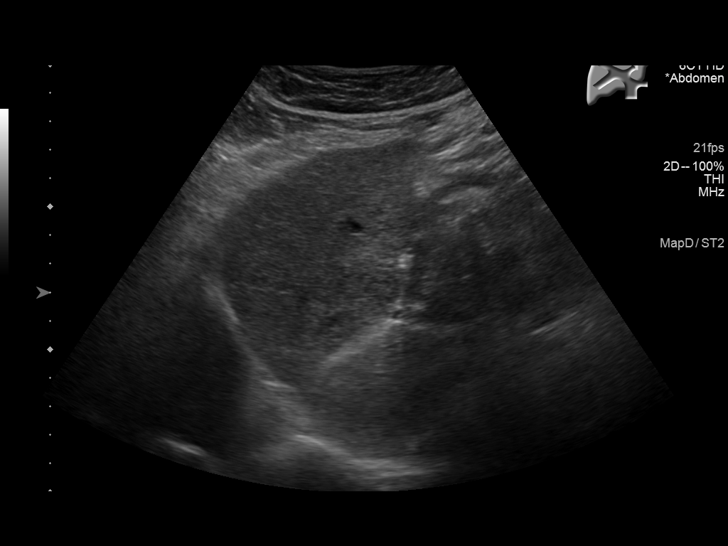
[im 24/44]
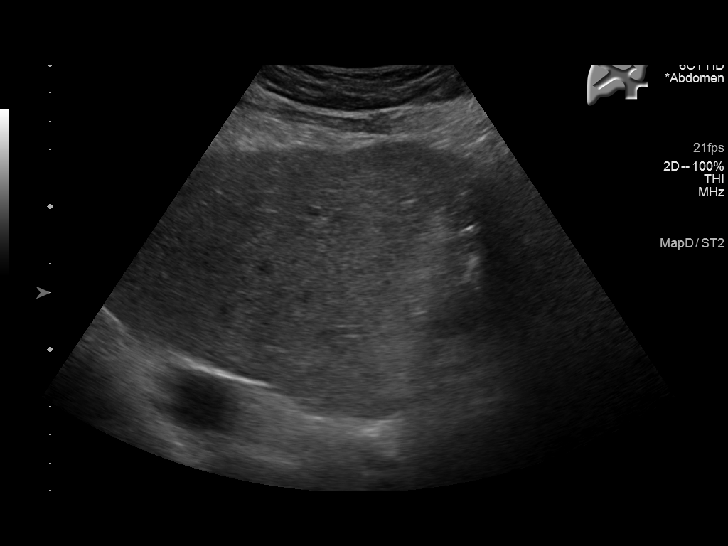
[im 27/44]
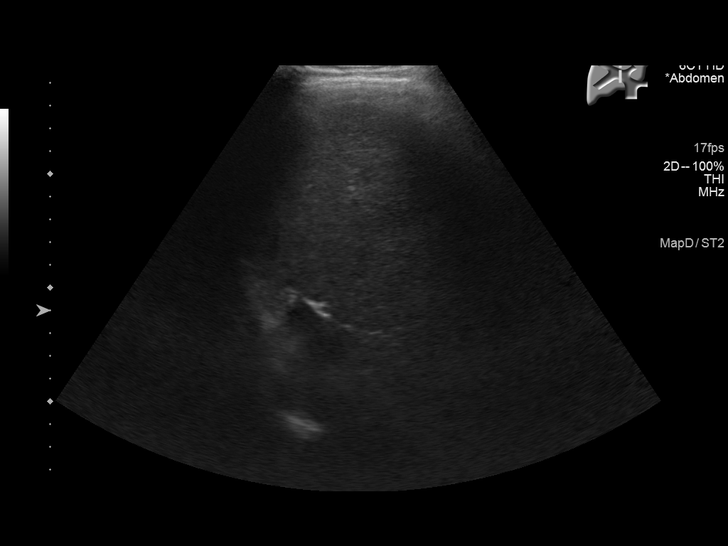
[im 29/44]
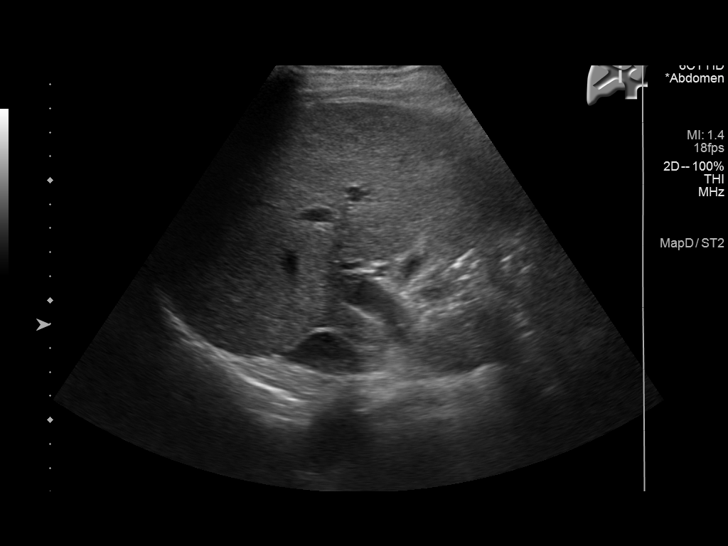
[im 33/44]
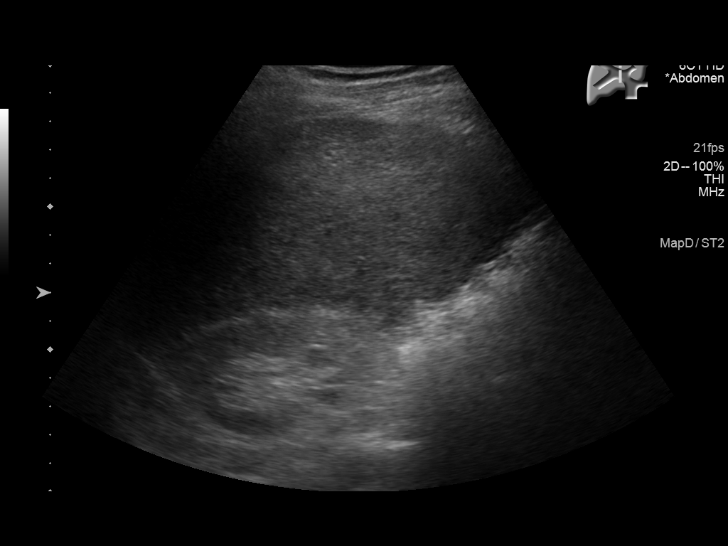
[im 36/44]
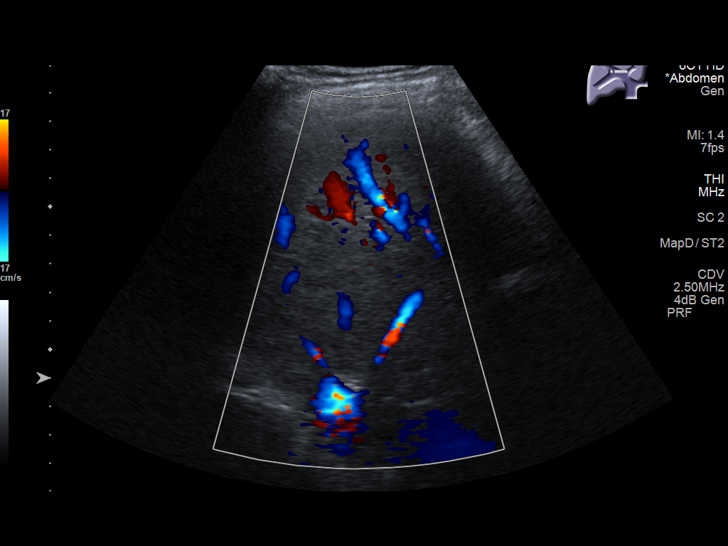
[im 40/44]
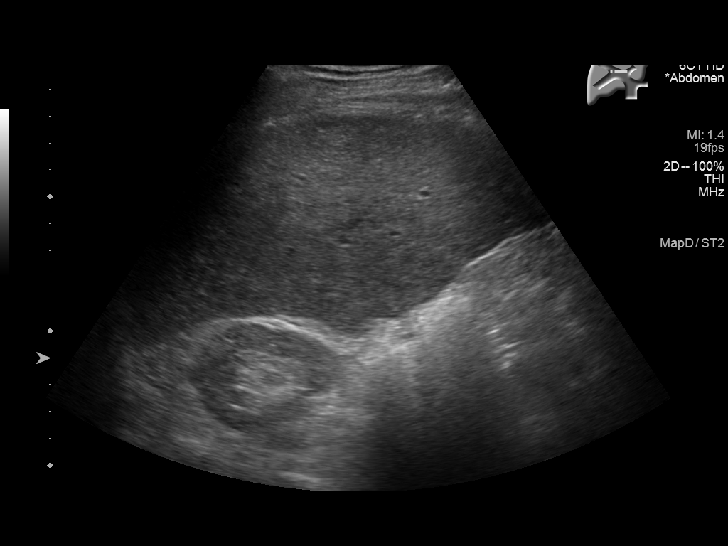
[im 44/44]
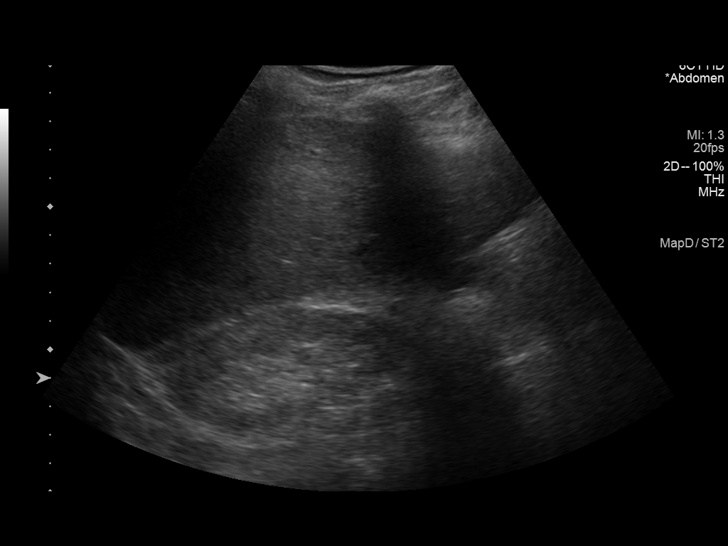

[14 of 25 positions shown; findings below may reference images not displayed]

FINDINGS: Gallbladder:

A 1.3 cm stone is seen in a decompressed gallbladder with no wall
thickening, pericholecystic fluid, or Murphy's sign.

Common bile duct:

Diameter: 2.6 mm

Liver:

The liver demonstrates a nodular contour consistent with reported
cirrhosis. No mass identified. Portal vein is patent on color
Doppler imaging with normal direction of blood flow towards the
liver.

Other: None.
IMPRESSION: 1. The gallbladder is relatively contracted limiting evaluation with
a single 1.3 cm stone identified.
2. Nodular contour to the liver consistent with cirrhosis.  No mass.

## 2022-08-20 IMAGING — DX DG CHEST 1V PORT
1 series · 1 of 1 positions shown · non-contrast
Comparison: None.

CLINICAL DATA: Abnormal labs elevated white count low hemoglobin

EXAM:
PORTABLE CHEST 1 VIEW

[chest ap]
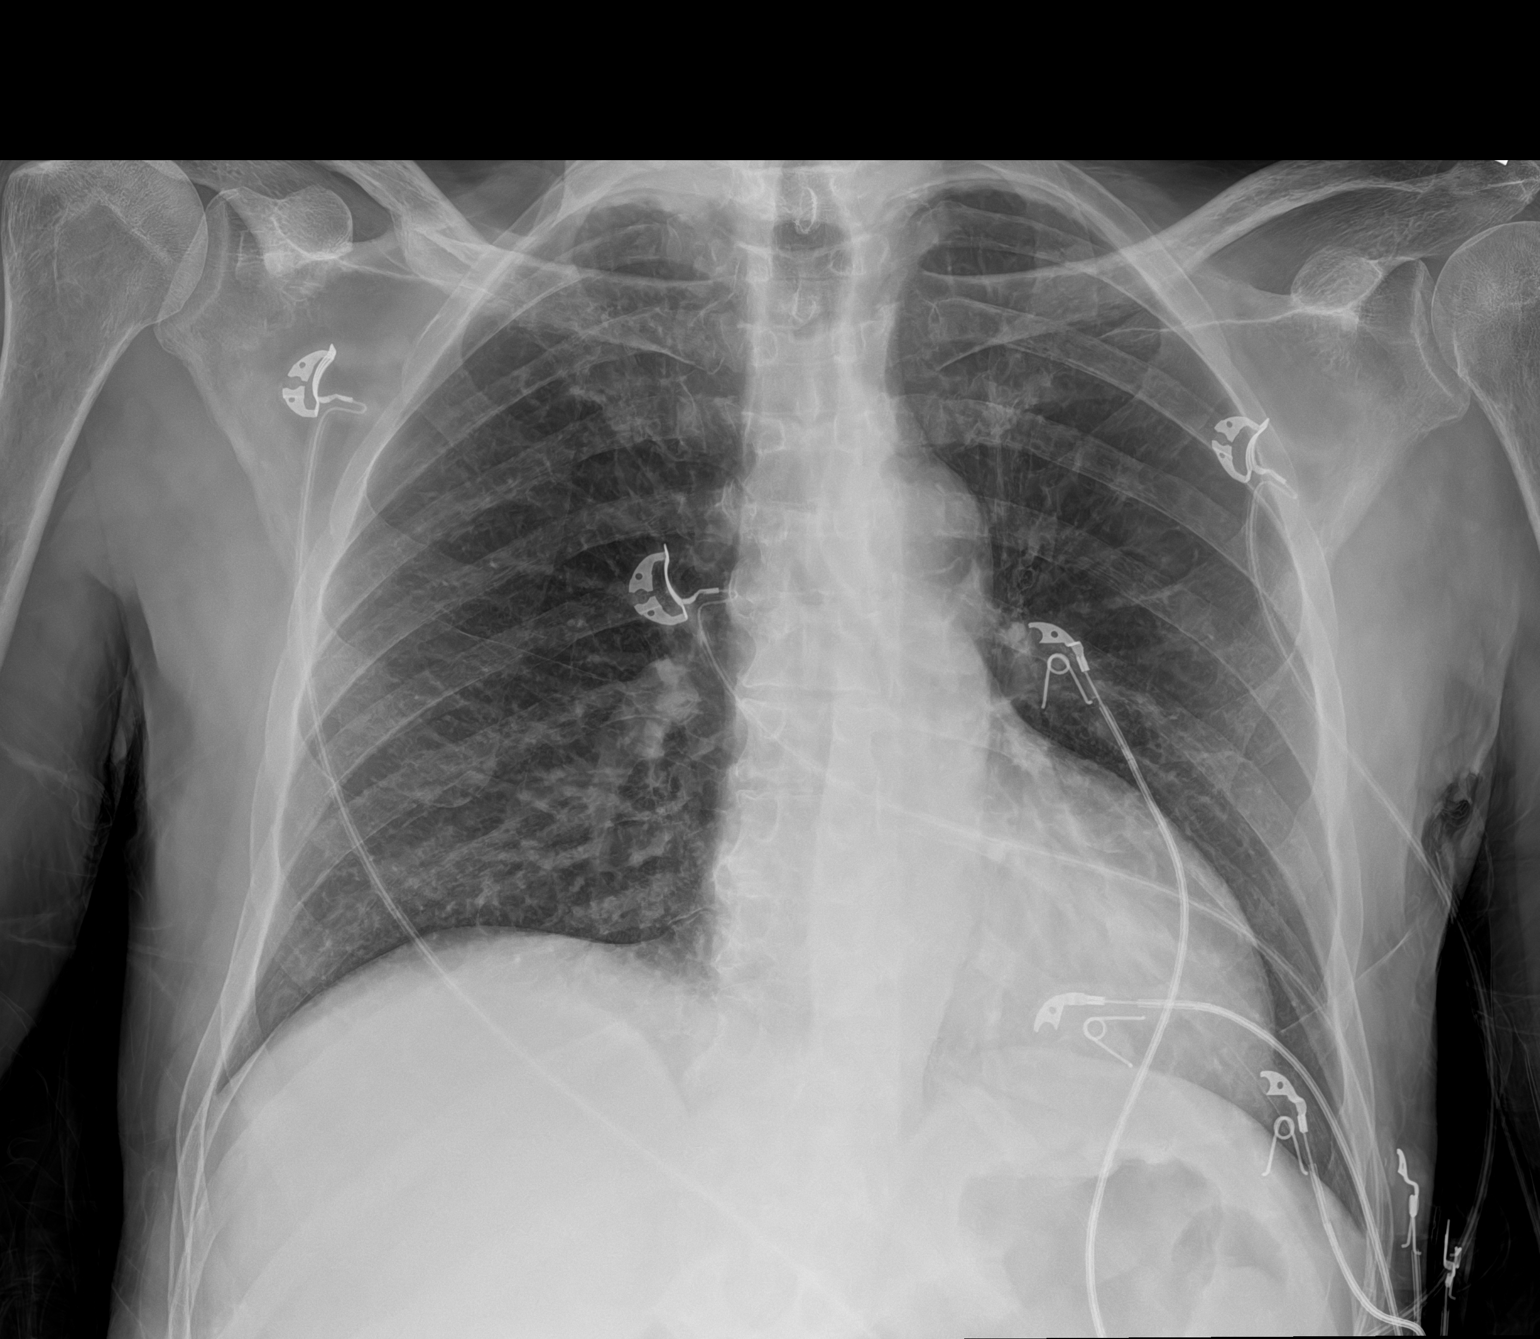

[1 of 1 positions shown; findings below may reference images not displayed]

FINDINGS: No focal opacity or pleural effusion. Cardiomediastinal silhouette
within normal limits. Aortic atherosclerosis. No pneumothorax.
IMPRESSION: No active disease.

## 2023-01-14 ENCOUNTER — Other Ambulatory Visit: Payer: Self-pay

## 2023-08-17 ENCOUNTER — Other Ambulatory Visit: Payer: Self-pay
